# Patient Record
Sex: Female | Born: 1980 | Race: Black or African American | Hispanic: No | Marital: Single | Smoking: Current every day smoker
Health system: Southern US, Community
[De-identification: ages and names within clinical notes are randomized; demographics above are authoritative.]

## PROBLEM LIST (undated history)

## (undated) DIAGNOSIS — J45909 Unspecified asthma, uncomplicated: Secondary | ICD-10-CM

## (undated) HISTORY — PX: TUBAL LIGATION: SHX77

---

## 2003-09-20 ENCOUNTER — Other Ambulatory Visit: Payer: Self-pay

## 2003-10-24 ENCOUNTER — Emergency Department: Payer: Self-pay | Admitting: Emergency Medicine

## 2003-11-04 ENCOUNTER — Inpatient Hospital Stay: Payer: Self-pay | Admitting: Obstetrics & Gynecology

## 2003-11-16 ENCOUNTER — Emergency Department: Payer: Self-pay | Admitting: Unknown Physician Specialty

## 2004-04-12 ENCOUNTER — Emergency Department: Payer: Self-pay | Admitting: Emergency Medicine

## 2004-05-20 ENCOUNTER — Emergency Department: Payer: Self-pay | Admitting: General Practice

## 2004-05-29 ENCOUNTER — Ambulatory Visit: Payer: Self-pay | Admitting: Family Medicine

## 2005-02-08 ENCOUNTER — Emergency Department: Payer: Self-pay | Admitting: Emergency Medicine

## 2005-02-13 ENCOUNTER — Emergency Department: Payer: Self-pay | Admitting: General Practice

## 2005-03-02 ENCOUNTER — Emergency Department: Payer: Self-pay | Admitting: Emergency Medicine

## 2005-07-12 ENCOUNTER — Emergency Department: Payer: Self-pay | Admitting: Unknown Physician Specialty

## 2005-09-27 ENCOUNTER — Emergency Department: Payer: Self-pay | Admitting: Internal Medicine

## 2006-06-04 ENCOUNTER — Emergency Department: Payer: Self-pay | Admitting: Emergency Medicine

## 2006-11-14 ENCOUNTER — Emergency Department: Payer: Self-pay

## 2007-03-24 ENCOUNTER — Emergency Department: Payer: Self-pay | Admitting: Emergency Medicine

## 2007-06-07 ENCOUNTER — Emergency Department: Payer: Self-pay | Admitting: Emergency Medicine

## 2007-06-17 ENCOUNTER — Emergency Department: Payer: Self-pay | Admitting: Emergency Medicine

## 2007-07-06 ENCOUNTER — Emergency Department: Payer: Self-pay | Admitting: Unknown Physician Specialty

## 2007-07-17 ENCOUNTER — Emergency Department: Payer: Self-pay | Admitting: Emergency Medicine

## 2007-09-02 ENCOUNTER — Emergency Department: Payer: Self-pay | Admitting: Emergency Medicine

## 2007-09-17 ENCOUNTER — Emergency Department: Payer: Self-pay | Admitting: Emergency Medicine

## 2007-10-02 ENCOUNTER — Observation Stay: Payer: Self-pay

## 2007-11-09 ENCOUNTER — Observation Stay: Payer: Self-pay

## 2007-12-10 ENCOUNTER — Observation Stay: Payer: Self-pay

## 2007-12-28 ENCOUNTER — Observation Stay: Payer: Self-pay | Admitting: Unknown Physician Specialty

## 2008-01-17 ENCOUNTER — Observation Stay: Payer: Self-pay

## 2008-01-20 ENCOUNTER — Observation Stay: Payer: Self-pay

## 2008-01-29 ENCOUNTER — Observation Stay: Payer: Self-pay | Admitting: Unknown Physician Specialty

## 2008-01-30 ENCOUNTER — Inpatient Hospital Stay: Payer: Self-pay

## 2008-05-31 ENCOUNTER — Emergency Department: Payer: Self-pay | Admitting: Emergency Medicine

## 2008-10-14 ENCOUNTER — Emergency Department: Payer: Self-pay | Admitting: Internal Medicine

## 2008-12-22 ENCOUNTER — Emergency Department: Payer: Self-pay | Admitting: Emergency Medicine

## 2009-01-19 HISTORY — PX: TUBAL LIGATION: SHX77

## 2009-02-01 ENCOUNTER — Emergency Department: Payer: Self-pay

## 2009-02-27 ENCOUNTER — Emergency Department: Payer: Self-pay | Admitting: Emergency Medicine

## 2009-07-27 ENCOUNTER — Emergency Department: Payer: Self-pay | Admitting: Internal Medicine

## 2009-09-06 ENCOUNTER — Emergency Department: Payer: Self-pay | Admitting: Emergency Medicine

## 2010-02-09 ENCOUNTER — Emergency Department: Payer: Self-pay | Admitting: Emergency Medicine

## 2010-02-27 ENCOUNTER — Emergency Department: Payer: Self-pay | Admitting: Emergency Medicine

## 2010-04-11 ENCOUNTER — Emergency Department: Payer: Self-pay | Admitting: Emergency Medicine

## 2010-05-22 ENCOUNTER — Emergency Department: Payer: Self-pay | Admitting: Emergency Medicine

## 2011-03-23 ENCOUNTER — Emergency Department: Payer: Self-pay | Admitting: Emergency Medicine

## 2011-06-30 ENCOUNTER — Emergency Department: Payer: Self-pay | Admitting: Emergency Medicine

## 2012-04-29 ENCOUNTER — Emergency Department: Payer: Self-pay | Admitting: Emergency Medicine

## 2012-11-21 ENCOUNTER — Emergency Department: Payer: Self-pay | Admitting: Emergency Medicine

## 2012-12-29 ENCOUNTER — Emergency Department: Payer: Self-pay | Admitting: Emergency Medicine

## 2012-12-29 LAB — CBC
HCT: 34.7 % — ABNORMAL LOW (ref 35.0–47.0)
MCHC: 33.8 g/dL (ref 32.0–36.0)
MCV: 88 fL (ref 80–100)
Platelet: 305 10*3/uL (ref 150–440)
RBC: 3.93 10*6/uL (ref 3.80–5.20)

## 2012-12-29 LAB — BASIC METABOLIC PANEL
EGFR (Non-African Amer.): >60
Potassium: 3.2 mmol/L — ABNORMAL LOW (ref 3.5–5.1)
Sodium: 140 mmol/L (ref 136–145)

## 2013-03-09 ENCOUNTER — Emergency Department: Payer: Self-pay | Admitting: Emergency Medicine

## 2013-03-09 LAB — URINALYSIS, COMPLETE
BILIRUBIN, UR: NEGATIVE
BLOOD: NEGATIVE
Bacteria: NONE SEEN
Glucose,UR: NEGATIVE mg/dL (ref 0–75)
Nitrite: NEGATIVE
Ph: 8 (ref 4.5–8.0)
Protein: NEGATIVE
RBC,UR: 6 /HPF (ref 0–5)
Specific Gravity: 1.021 (ref 1.003–1.030)
Squamous Epithelial: 12

## 2013-03-09 LAB — COMPREHENSIVE METABOLIC PANEL
ALT: 33 U/L (ref 12–78)
ANION GAP: 11 (ref 7–16)
AST: 26 U/L (ref 15–37)
Albumin: 3.7 g/dL (ref 3.4–5.0)
Alkaline Phosphatase: 65 U/L
BILIRUBIN TOTAL: 0.3 mg/dL (ref 0.2–1.0)
BUN: 8 mg/dL (ref 7–18)
CHLORIDE: 108 mmol/L — AB (ref 98–107)
CREATININE: 0.81 mg/dL (ref 0.60–1.30)
Calcium, Total: 9 mg/dL (ref 8.5–10.1)
Co2: 22 mmol/L (ref 21–32)
EGFR (African American): >60
EGFR (Non-African Amer.): >60
GLUCOSE: 106 mg/dL — AB (ref 65–99)
Osmolality: 280 (ref 275–301)
Potassium: 3.8 mmol/L (ref 3.5–5.1)
Sodium: 141 mmol/L (ref 136–145)
Total Protein: 7.9 g/dL (ref 6.4–8.2)

## 2013-03-09 LAB — CBC WITH DIFFERENTIAL/PLATELET
BASOS ABS: 0 10*3/uL (ref 0.0–0.1)
BASOS PCT: 0.1 %
EOS PCT: 0.2 %
Eosinophil #: 0 10*3/uL (ref 0.0–0.7)
HCT: 37.3 % (ref 35.0–47.0)
HGB: 12.3 g/dL (ref 12.0–16.0)
LYMPHS ABS: 0.4 10*3/uL — AB (ref 1.0–3.6)
Lymphocyte %: 5.2 %
MCH: 29.7 pg (ref 26.0–34.0)
MCHC: 33 g/dL (ref 32.0–36.0)
MCV: 90 fL (ref 80–100)
MONOS PCT: 3 %
Monocyte #: 0.2 x10 3/mm (ref 0.2–0.9)
NEUTROS ABS: 7.6 10*3/uL — AB (ref 1.4–6.5)
Neutrophil %: 91.5 %
PLATELETS: 265 10*3/uL (ref 150–440)
RBC: 4.14 10*6/uL (ref 3.80–5.20)
RDW: 17 % — ABNORMAL HIGH (ref 11.5–14.5)
WBC: 8.3 10*3/uL (ref 3.6–11.0)

## 2013-03-09 LAB — PREGNANCY, URINE: Pregnancy Test, Urine: NEGATIVE m[IU]/mL

## 2013-06-16 ENCOUNTER — Emergency Department: Payer: Self-pay | Admitting: Emergency Medicine

## 2013-11-26 ENCOUNTER — Emergency Department: Payer: Self-pay | Admitting: Emergency Medicine

## 2014-04-11 ENCOUNTER — Emergency Department: Payer: Self-pay | Admitting: Emergency Medicine

## 2014-07-06 ENCOUNTER — Encounter: Payer: Self-pay | Admitting: Emergency Medicine

## 2014-07-06 ENCOUNTER — Emergency Department
Admission: EM | Admit: 2014-07-06 | Discharge: 2014-07-06 | Disposition: A | Discharge status: Home / Self Care | Payer: Self-pay | Attending: Emergency Medicine | Admitting: Emergency Medicine

## 2014-07-06 DIAGNOSIS — Z720 Tobacco use: Secondary | ICD-10-CM | POA: Insufficient documentation

## 2014-07-06 DIAGNOSIS — K047 Periapical abscess without sinus: Secondary | ICD-10-CM | POA: Insufficient documentation

## 2014-07-06 DIAGNOSIS — K029 Dental caries, unspecified: Secondary | ICD-10-CM | POA: Insufficient documentation

## 2014-07-06 MED ORDER — IBUPROFEN 800 MG PO TABS: 800.0000 mg | ORAL_TABLET | Freq: Three times a day (TID) | ORAL | Status: DC

## 2014-07-06 MED ORDER — AMOXICILLIN 500 MG PO CAPS: 500.0000 mg | ORAL_CAPSULE | Freq: Three times a day (TID) | ORAL | Status: DC

## 2014-07-06 NOTE — Discharge Instructions (Signed)
Dental Abscess A dental abscess is a collection of infected fluid (pus) from a bacterial infection in the inner part of the tooth (pulp). It usually occurs at the end of the tooth's root.  CAUSES   Severe tooth decay.  Trauma to the tooth that allows bacteria to enter into the pulp, such as a broken or chipped tooth. SYMPTOMS   Severe pain in and around the infected tooth.  Swelling and redness around the abscessed tooth or in the mouth or face.  Tenderness.  Pus drainage.  Bad breath.  Bitter taste in the mouth.  Difficulty swallowing.  Difficulty opening the mouth.  Nausea.  Vomiting.  Chills.  Swollen neck glands. DIAGNOSIS   A medical and dental history will be taken.  An examination will be performed by tapping on the abscessed tooth.  X-rays may be taken of the tooth to identify the abscess. TREATMENT The goal of treatment is to eliminate the infection. You may be prescribed antibiotic medicine to stop the infection from spreading. A root canal may be performed to save the tooth. If the tooth cannot be saved, it may be pulled (extracted) and the abscess may be drained.  HOME CARE INSTRUCTIONS  Only take over-the-counter or prescription medicines for pain, fever, or discomfort as directed by your caregiver.  Rinse your mouth (gargle) often with salt water ( tsp salt in 8 oz [250 ml] of warm water) to relieve pain or swelling.  Do not drive after taking pain medicine (narcotics).  Do not apply heat to the outside of your face.  Return to your dentist for further treatment as directed. SEEK MEDICAL CARE IF:  Your pain is not helped by medicine.  Your pain is getting worse instead of better. SEEK IMMEDIATE MEDICAL CARE IF:  You have a fever or persistent symptoms for more than 2-3 days.  You have a fever and your symptoms suddenly get worse.  You have chills or a very bad headache.  You have problems breathing or swallowing.  You have trouble  opening your mouth.  You have swelling in the neck or around the eye. Document Released: 01/05/2005 Document Revised: 09/30/2011 Document Reviewed: 04/15/2010 [facility] Patient Information 2015 [place], [place]. This information is not intended to replace advice given to you by your health care provider. Make sure you discuss any questions you have with your health care provider.   OPTIONS FOR DENTAL FOLLOW UP CARE  [place] [facility] and [place] [email or web address] [phone])  [facility] [phone])  [place] [phone])  [place] [phone])  [facility] [phone]) This clinic caters to the indigent population and is on a lottery system. Location: [employer] [facility], [facility], [place], [place] [facility] Hours: Wednesdays from 6pm - 9pm, patients seen by a lottery system. For dates, call or go to [email or web address] Services: Cleanings, fillings and simple extractions. Payment Options: DENTAL WORK IS FREE OF CHARGE. Bring proof of income or support. Best way to get seen: Arrive at 5:15 pm - this is a lottery, NOT first come/first serve, so arriving earlier will not increase your chances of being seen.     [facility] [phone] Select option 1 for emergencies   Location: [facility], [place], [address] [place] [facility] Hours: No walk-ins accepted - call the day before to schedule an appointment. Check in times  are 9:30 am and 1:30 pm. Services: Simple extractions, temporary fillings, pulpectomy/pulp debridement, uncomplicated abscess drainage. Payment Options: PAYMENT IS DUE AT THE TIME OF SERVICE.  Fee is usually $100-200, additional surgical procedures (e.g. abscess drainage) may  be extra. Cash, checks, Visa/MasterCard accepted.  Can file Medicaid if patient is covered for dental - patient should call case worker to check. No discount for [facility] patients. Best way to get seen: MUST call the day before and get onto the schedule. Can usually be seen the next 1-2 days. No walk-ins accepted.     [place] [phone]   Location: [place], [place] [facility] Hours: M, W, Th, F 8am or 1:30pm, Tues 9a or 1:30 - first come/first served. Services: Simple extractions, temporary fillings, uncomplicated abscess drainage.  You do not need to be an [facility] resident. Payment Options: PAYMENT IS DUE AT THE TIME OF SERVICE. Dental insurance, otherwise sliding scale - bring proof of income or support. Depending on income and treatment needed, cost is usually $50-200. Best way to get seen: Arrive early as it is first come/first served.     [facility] [phone]   Location: [place] [facility] Hours: Mon-Thu 8a-5p Services: Most basic dental services including extractions and fillings. Payment Options: PAYMENT IS DUE AT THE TIME OF SERVICE. Sliding scale, up to 50% off - bring proof if income or support. Medicaid with dental option accepted. Best way to get seen: Call to schedule an appointment, can usually be seen within 2 weeks OR they will try to see walk-ins - show up at [place] or 2p (you may have to wait).     [facility] [place] RESIDENTS ONLY   Location: [facility], [place] [address] [place],  [address] Clinic Hours: By appointment only. Monday - Thursday 8am-5pm, Friday 8am-12pm Services: Cleanings, fillings, extractions. Payment Options: PAYMENT IS DUE AT THE TIME OF SERVICE. Cash, Visa or MasterCard. Sliding scale - $30 minimum per service. Best way to get seen: Come in to  office, complete packet and make an appointment - need proof of income or support monies for each household member and proof of [facility] residence. Usually takes about a month to get in.     [facility] [phone]   Location: [address] [place] [facility] Hours: Walk-in Urgent Care Dental Services are offered Monday-Friday mornings only. The numbers of emergencies accepted daily is limited to the number of providers available. Maximum 15 - Mondays, Wednesdays & Thursdays Maximum 10 - Tuesdays & Fridays Services: You do not need to be a [facility] resident to be seen for a dental emergency. Emergencies are defined as pain, swelling, abnormal bleeding, or dental trauma. Walkins will receive x-rays if needed. NOTE: Dental cleaning is not an emergency. Payment Options: PAYMENT IS DUE AT THE TIME OF SERVICE. Minimum co-pay is $40.00 for uninsured patients. Minimum co-pay is $3.00 for Medicaid with dental coverage. Dental Insurance is accepted and must be presented at time of visit. Medicare does not cover dental. Forms of payment: Cash, credit card, checks. Best way to get seen: If not previously registered with the clinic, walk-in dental registration begins at 7:15 am and is on a first come/first serve basis. If previously registered with the clinic, call to make an appointment.     [facility] [place] ONLY   Location: [address]  [place], [place] Clinic Hours: Mon-Thu 10a-2p Services: Extractions only! Payment Options: FREE (donations accepted) - bring proof of income or support Best way to get seen: Call and schedule an appointment OR come at 8am on the 1st Monday of every month (except for holidays) when it is first come/first served.     [facility] [phone]   Location: [place], [place] Clinic Hours: Friday mornings Services, Payment Options, Best way to get  seen: Call for info

## 2014-07-06 NOTE — ED Notes (Signed)
Pt to ed with c/o upper left dental pain since last night.

## 2014-07-06 NOTE — ED Provider Notes (Signed)
[facility] Emergency Department Provider Note  ____________________________________________  Time seen: Approximately 8:05 AM  I have reviewed the triage vital signs and the nursing notes.   HISTORY  Chief Complaint Dental Pain    HPI Carolyn Bryant is a 34 y.o. female is here today with complaint of left dental pain since last evening. She states she has taken some ibuprofen yesterday with minimal relief. She does not have a regular dentist to see. Currently her pain is 7 out of 10. She continues to smoke which she states makes her teeth hurt more. There is nothing that she has taken making it feel better.She has not seen a dentist in many years.   History reviewed. No pertinent past medical history.  There are no active problems to display for this patient.   Past Surgical History  Procedure Laterality Date  . Tubal ligation      Current Outpatient Rx  Name  Route  Sig  Dispense  Refill  . amoxicillin (AMOXIL) 500 MG capsule   Oral   Take 1 capsule (500 mg total) by mouth 3 (three) times daily.   30 capsule   0   . ibuprofen (ADVIL,MOTRIN) 800 MG tablet   Oral   Take 1 tablet (800 mg total) by mouth 3 (three) times daily.   30 tablet   0     Allergies Prednisone  History reviewed. No pertinent family history.  Social History History  Substance Use Topics  . Smoking status: Current Every Day Smoker  . Smokeless tobacco: Not on file  . Alcohol Use: Yes    Review of Systems Constitutional: No fever/chills Eyes: No visual changes. ENT: No sore throat. Cardiovascular: Denies chest pain. Respiratory: Denies shortness of breath. Gastrointestinal: No abdominal pain.  No nausea, no vomiting. Genitourinary: Negative for dysuria. Musculoskeletal: Negative for back pain. Skin: Negative for rash. Neurological: Negative for headaches  10-point ROS otherwise negative.  ____________________________________________   PHYSICAL  EXAM:  VITAL SIGNS: ED Triage Vitals  Enc Vitals Group     BP 07/06/14 0736 131/83 mmHg     Pulse Rate 07/06/14 0736 81     Resp 07/06/14 0736 18     Temp 07/06/14 0736 98.2 F (36.8 C)     Temp Source 07/06/14 0736 Oral     SpO2 07/06/14 0736 100 %     Weight 07/06/14 0736 132 lb (59.875 kg)     Height 07/06/14 0736 5\' 3"  (1.6 m)     Head Cir --      Peak Flow --      Pain Score 07/06/14 0738 7     Pain Loc --      Pain Edu? --      Excl. in [place]? --     Constitutional: Alert and oriented. Well appearing and in no acute distress. Eyes: Conjunctivae are normal. PERRL. EOMI. Head: Atraumatic. Nose: No congestion/rhinnorhea. Mouth/Throat: Mucous membranes are moist.  Oropharynx non-erythematous. Left upper and lower teeth are in very poor dental hygiene. There are multiple dental caries present. Lower molar with, edema and tenderness. No actual abscess is seen. Neck: No stridor.  [place]le Hematological/Lymphatic/Immunilogical: No cervical lymphadenopathy. Cardiovascular: Normal rate, regular rhythm. Grossly normal heart sounds.  Good peripheral circulation. Respiratory: Normal respiratory effort.  No retractions. Lungs CTAB. Gastrointestinal: Soft and nontender. No distention.  Musculoskeletal: No lower extremity tenderness nor edema.  No joint effusions. Neurologic:  Normal speech and language. No gross focal neurologic deficits are appreciated. Speech is  normal. No gait instability. Skin:  Skin is warm, dry and intact. No rash noted. Psychiatric: Mood and affect are normal. Speech and behavior are normal.  ____________________________________________   LABS (all labs ordered are listed, but only abnormal results are displayed)  Labs Reviewed - No data to display  PROCEDURES  Procedure(s) performed: None  Critical Care performed: No  ____________________________________________   INITIAL IMPRESSION / ASSESSMENT AND PLAN / ED COURSE  Pertinent labs & imaging results  that were available during my care of the patient were reviewed by me and considered in my medical decision making (see chart for details).  Patient was given a list of dental clinics in the area that on a sliding scale. She is given a prescription for amoxicillin and ibuprofen to begin today. ____________________________________________   FINAL CLINICAL IMPRESSION(S) / ED DIAGNOSES  Final diagnoses:  Dental abscess  Dental caries      Johnn Hai, PA-C 07/06/14 1055  Harvest Dark, MD 07/06/14 1423

## 2014-08-21 ENCOUNTER — Emergency Department: Payer: Self-pay

## 2014-08-21 ENCOUNTER — Encounter: Payer: Self-pay | Admitting: Emergency Medicine

## 2014-08-21 ENCOUNTER — Emergency Department
Admission: EM | Admit: 2014-08-21 | Discharge: 2014-08-21 | Disposition: A | Discharge status: Home / Self Care | Payer: Self-pay | Attending: Emergency Medicine | Admitting: Emergency Medicine

## 2014-08-21 DIAGNOSIS — Z3202 Encounter for pregnancy test, result negative: Secondary | ICD-10-CM | POA: Insufficient documentation

## 2014-08-21 DIAGNOSIS — Z720 Tobacco use: Secondary | ICD-10-CM | POA: Insufficient documentation

## 2014-08-21 DIAGNOSIS — N390 Urinary tract infection, site not specified: Secondary | ICD-10-CM | POA: Insufficient documentation

## 2014-08-21 DIAGNOSIS — R109 Unspecified abdominal pain: Secondary | ICD-10-CM

## 2014-08-21 HISTORY — DX: Unspecified asthma, uncomplicated: J45.909

## 2014-08-21 LAB — URINALYSIS COMPLETE WITH MICROSCOPIC (ARMC ONLY)
Bilirubin Urine: NEGATIVE
Glucose, UA: NEGATIVE mg/dL
Ketones, ur: NEGATIVE mg/dL
Leukocytes, UA: NEGATIVE
NITRITE: NEGATIVE
Protein, ur: 30 mg/dL — AB
Specific Gravity, Urine: 1.002 — ABNORMAL LOW (ref 1.005–1.030)
pH: 7 (ref 5.0–8.0)

## 2014-08-21 LAB — PREGNANCY, URINE: PREG TEST UR: NEGATIVE

## 2014-08-21 LAB — POCT PREGNANCY, URINE: Preg Test, Ur: NEGATIVE

## 2014-08-21 MED ORDER — IOHEXOL 300 MG/ML  SOLN
100.0000 mL | Freq: Once | INTRAMUSCULAR | Status: AC | PRN
  Administered 2014-08-21: 100 mL via INTRAVENOUS

## 2014-08-21 MED ORDER — SODIUM CHLORIDE 0.9 % IV SOLN
Freq: Once | INTRAVENOUS | Status: AC
  Administered 2014-08-21: 10:00:00 via INTRAVENOUS

## 2014-08-21 MED ORDER — ONDANSETRON HCL 4 MG/2ML IJ SOLN
4.0000 mg | Freq: Once | INTRAMUSCULAR | Status: AC
  Administered 2014-08-21: 4 mg via INTRAVENOUS
  Filled 2014-08-21: qty 2

## 2014-08-21 MED ORDER — MORPHINE SULFATE 2 MG/ML IJ SOLN
2.0000 mg | Freq: Once | INTRAMUSCULAR | Status: AC
  Administered 2014-08-21: 2 mg via INTRAVENOUS
  Filled 2014-08-21: qty 1

## 2014-08-21 MED ORDER — IOHEXOL 240 MG/ML SOLN
25.0000 mL | Freq: Once | INTRAMUSCULAR | Status: AC | PRN
  Administered 2014-08-21: 25 mL via ORAL

## 2014-08-21 NOTE — ED Notes (Signed)
Patient presents to the ED with nausea, vomiting x 4, and lower abdominal pain that began shortly after midnight.  Patient states she is currently on her menstrual cycle.  Patient is gaurding lower abdomen.  Patient also reports multiple episodes of diarrhea.

## 2014-08-21 NOTE — Discharge Instructions (Signed)
Abdominal Pain, Women °Abdominal (stomach, pelvic, or belly) pain can be caused by many things. It is important to tell your doctor: °· The location of the pain. °· Does it come and go or is it present all the time? °· Are there things that start the pain (eating certain foods, exercise)? °· Are there other symptoms associated with the pain (fever, nausea, vomiting, diarrhea)? °All of this is helpful to know when trying to find the cause of the pain. °CAUSES  °· Stomach: virus or bacteria infection, or ulcer. °· Intestine: appendicitis (inflamed appendix), regional ileitis (Crohn's disease), ulcerative colitis (inflamed colon), irritable bowel syndrome, diverticulitis (inflamed diverticulum of the colon), or cancer of the stomach or intestine. °· Gallbladder disease or stones in the gallbladder. °· Kidney disease, kidney stones, or infection. °· Pancreas infection or cancer. °· Fibromyalgia (pain disorder). °· Diseases of the female organs: °¨ Uterus: fibroid (non-cancerous) tumors or infection. °¨ Fallopian tubes: infection or tubal pregnancy. °¨ Ovary: cysts or tumors. °¨ Pelvic adhesions (scar tissue). °¨ Endometriosis (uterus lining tissue growing in the pelvis and on the pelvic organs). °¨ Pelvic congestion syndrome (female organs filling up with blood just before the menstrual period). °¨ Pain with the menstrual period. °¨ Pain with ovulation (producing an egg). °¨ Pain with an IUD (intrauterine device, birth control) in the uterus. °¨ Cancer of the female organs. °· Functional pain (pain not caused by a disease, may improve without treatment). °· Psychological pain. °· Depression. °DIAGNOSIS  °Your doctor will decide the seriousness of your pain by doing an examination. °· Blood tests. °· X-rays. °· Ultrasound. °· CT scan (computed tomography, special type of X-ray). °· MRI (magnetic resonance imaging). °· Cultures, for infection. °· Barium enema (dye inserted in the large intestine, to better view it with  X-rays). °· Colonoscopy (looking in intestine with a lighted tube). °· Laparoscopy (minor surgery, looking in abdomen with a lighted tube). °· Major abdominal exploratory surgery (looking in abdomen with a large incision). °TREATMENT  °The treatment will depend on the cause of the pain.  °· Many cases can be observed and treated at home. °· Over-the-counter medicines recommended by your caregiver. °· Prescription medicine. °· Antibiotics, for infection. °· Birth control pills, for painful periods or for ovulation pain. °· Hormone treatment, for endometriosis. °· Nerve blocking injections. °· Physical therapy. °· Antidepressants. °· Counseling with a psychologist or psychiatrist. °· Minor or major surgery. °HOME CARE INSTRUCTIONS  °· Do not take laxatives, unless directed by your caregiver. °· Take over-the-counter pain medicine only if ordered by your caregiver. Do not take aspirin because it can cause an upset stomach or bleeding. °· Try a clear liquid diet (broth or water) as ordered by your caregiver. Slowly move to a bland diet, as tolerated, if the pain is related to the stomach or intestine. °· Have a thermometer and take your temperature several times a day, and record it. °· Bed rest and sleep, if it helps the pain. °· Avoid sexual intercourse, if it causes pain. °· Avoid stressful situations. °· Keep your follow-up appointments and tests, as your caregiver orders. °· If the pain does not go away with medicine or surgery, you may try: °¨ Acupuncture. °¨ Relaxation exercises (yoga, meditation). °¨ Group therapy. °¨ Counseling. °SEEK MEDICAL CARE IF:  °· You notice certain foods cause stomach pain. °· Your home care treatment is not helping your pain. °· You need stronger pain medicine. °· You want your IUD removed. °· You feel faint or   lightheaded.  You develop nausea and vomiting.  You develop a rash.  You are having side effects or an allergy to your medicine. SEEK IMMEDIATE MEDICAL CARE IF:   Your  pain does not go away or gets worse.  You have a fever.  Your pain is felt only in portions of the abdomen. The right side could possibly be appendicitis. The left lower portion of the abdomen could be colitis or diverticulitis.  You are passing blood in your stools (bright red or black tarry stools, with or without vomiting).  You have blood in your urine.  You develop chills, with or without a fever.  You pass out. MAKE SURE YOU:   Understand these instructions.  Will watch your condition.  Will get help right away if you are not doing well or get worse. Document Released: 11/02/2006 Document Revised: 05/22/2013 Document Reviewed: 11/22/2008 [facility] Patient Information 2015 [place], [place]. This information is not intended to replace advice given to you by your health care provider. Make sure you discuss any questions you have with your health care provider.  Please use the keflex 500 mg 1 pill  4 x a day for the uti. You can take motrin 800 mg up to 3 x a day as needed for the pain (take it with food.)  Please return for fever, worse pain or continued vomiting.

## 2014-08-21 NOTE — ED Provider Notes (Signed)
[facility] Emergency Department Provider Note  ____________________________________________  Time seen: Approximately 1:13 PM  I have reviewed the triage vital signs and the nursing notes.   HISTORY  Chief Complaint Abdominal Pain    HPI Carolyn Bryant is a 34 y.o. female patient started her menstrual period last night. She began to have abdominal pain. She vomited 4-5 times. Began and began to have diarrhea. She has had this kind of thing in the past but the cramps and pain were worse than usual. She came to the ER still in discomfort. She has not had a fever.   Past Medical History  Diagnosis Date  . Asthma     There are no active problems to display for this patient.   Past Surgical History  Procedure Laterality Date  . Tubal ligation      Current Outpatient Rx  Name  Route  Sig  Dispense  Refill  . amoxicillin (AMOXIL) 500 MG capsule   Oral   Take 1 capsule (500 mg total) by mouth 3 (three) times daily. Patient not taking: Reported on 08/21/2014   30 capsule   0   . ibuprofen (ADVIL,MOTRIN) 800 MG tablet   Oral   Take 1 tablet (800 mg total) by mouth 3 (three) times daily. Patient not taking: Reported on 08/21/2014   30 tablet   0     Allergies Peanuts and Prednisone  No family history on file.  Social History History  Substance Use Topics  . Smoking status: Current Every Day Smoker  . Smokeless tobacco: Not on file  . Alcohol Use: Yes    Review of Systems Constitutional: No fever/chills Eyes: No visual changes. ENT: No sore throat. Cardiovascular: Denies chest pain. Respiratory: Denies shortness of breath. Gastrointestinal: See history of present illness  .  No constipation. Genitourinary: Negative for dysuria. Musculoskeletal: Negative for back pain. Skin: Negative for rash. Neurological: Negative for headaches, focal weakness or numbness.  10-point ROS otherwise  negative.  ____________________________________________   PHYSICAL EXAM:  VITAL SIGNS: ED Triage Vitals  Enc Vitals Group     BP 08/21/14 0812 150/80 mmHg     Pulse Rate 08/21/14 0812 102     Resp 08/21/14 0812 20     Temp --      Temp src --      SpO2 08/21/14 0812 100 %     Weight 08/21/14 0808 135 lb (61.236 kg)     Height 08/21/14 0808 5\' 2"  (1.575 m)     Head Cir --      Peak Flow --      Pain Score 08/21/14 0808 9     Pain Loc --      Pain Edu? --      Excl. in [place]? --     Constitutional: Alert and oriented. Well appearing and in mild distress Eyes: Conjunctivae are normal. PERRL. EOMI. Head: Atraumatic. Nose: No congestion/rhinnorhea. Mouth/Throat: Mucous membranes are moist.  Oropharynx non-erythematous. Neck: No stridor. Cardiovascular: Normal rate, regular rhythm. Grossly normal heart sounds.  Good peripheral circulation. Respiratory: Normal respiratory effort.  No retractions. Lungs CTAB. Gastrointestinal: Soft mildly diffusely tender. No distention. No abdominal bruits. No CVA tenderness. Musculoskeletal: No lower extremity tenderness nor edema.  No joint effusions. Neurologic:  Normal speech and language. No gross focal neurologic deficits are appreciated. No gait instability. Skin:  Skin is warm, dry and intact. No rash noted. Psychiatric: Mood and affect are normal. Speech and behavior are normal.  ____________________________________________  LABS (all labs ordered are listed, but only abnormal results are displayed)  Labs Reviewed  URINALYSIS COMPLETEWITH MICROSCOPIC ([place]) - Abnormal; Notable for the following:    Color, Urine YELLOW (*)    APPearance CLEAR (*)    Specific Gravity, Urine 1.002 (*)    Hgb urine dipstick 3+ (*)    Protein, ur 30 (*)    Bacteria, UA RARE (*)    Squamous Epithelial / LPF 0-5 (*)    All other components within normal limits  PREGNANCY, URINE  POCT PREGNANCY, URINE    ____________________________________________  EKG   ____________________________________________  RADIOLOGY  CT was read as some free fluid lower down in the pelvis which may represent inflammation. There is no other findings. ____________________________________________   PROCEDURES    ____________________________________________   INITIAL IMPRESSION / ASSESSMENT AND PLAN / ED COURSE  Pertinent labs & imaging results that were available during my care of the patient were reviewed by me and considered in my medical decision making (see chart for details).  Patient denies any chance of STD. She does have a few white cells in her urine we'll treat her with Keflex for UTI ____________________________________________   FINAL CLINICAL IMPRESSION(S) / ED DIAGNOSES  Final diagnoses:  Abdominal pain, unspecified abdominal location  UTI (lower urinary tract infection)      Nena Polio, MD 08/21/14 1606

## 2014-10-31 ENCOUNTER — Encounter (HOSPITAL_COMMUNITY): Payer: Self-pay | Admitting: *Deleted

## 2014-10-31 ENCOUNTER — Emergency Department (HOSPITAL_COMMUNITY)
Admission: EM | Admit: 2014-10-31 | Discharge: 2014-10-31 | Disposition: A | Discharge status: Home / Self Care | Payer: Self-pay | Attending: Emergency Medicine | Admitting: Emergency Medicine

## 2014-10-31 DIAGNOSIS — J45909 Unspecified asthma, uncomplicated: Secondary | ICD-10-CM | POA: Insufficient documentation

## 2014-10-31 DIAGNOSIS — Z791 Long term (current) use of non-steroidal anti-inflammatories (NSAID): Secondary | ICD-10-CM | POA: Insufficient documentation

## 2014-10-31 DIAGNOSIS — Z720 Tobacco use: Secondary | ICD-10-CM | POA: Insufficient documentation

## 2014-10-31 DIAGNOSIS — Z792 Long term (current) use of antibiotics: Secondary | ICD-10-CM | POA: Insufficient documentation

## 2014-10-31 DIAGNOSIS — H1031 Unspecified acute conjunctivitis, right eye: Secondary | ICD-10-CM | POA: Insufficient documentation

## 2014-10-31 DIAGNOSIS — H109 Unspecified conjunctivitis: Secondary | ICD-10-CM

## 2014-10-31 MED ORDER — FLUORESCEIN SODIUM 1 MG OP STRP
1.0000 | ORAL_STRIP | Freq: Once | OPHTHALMIC | Status: AC
  Administered 2014-10-31: 1 via OPHTHALMIC
  Filled 2014-10-31: qty 1

## 2014-10-31 MED ORDER — POLYMYXIN B-TRIMETHOPRIM 10000-0.1 UNIT/ML-% OP SOLN: 2.0000 [drp] | Freq: Four times a day (QID) | OPHTHALMIC | Status: DC

## 2014-10-31 MED ORDER — TETRACAINE HCL 0.5 % OP SOLN
1.0000 [drp] | Freq: Once | OPHTHALMIC | Status: AC
  Administered 2014-10-31: 1 [drp] via OPHTHALMIC
  Filled 2014-10-31: qty 2

## 2014-10-31 NOTE — ED Notes (Signed)
Declined W/C at D/C and was escorted to lobby by RN. 

## 2014-10-31 NOTE — ED Notes (Signed)
PT reports the RT eye has been draining for several  Days .

## 2014-10-31 NOTE — Discharge Instructions (Signed)

## 2014-10-31 NOTE — ED Provider Notes (Signed)
CSN: [id number]     Arrival date & time 10/31/14  9937 History   By signing my name below, I, Evelene Croon, attest that this documentation has been prepared under the direction and in the presence of non-physician practitioner, Waynetta Pean, PA-C. Electronically Signed: Evelene Croon, Scribe. 10/31/2014. 10:53 AM.  Chief Complaint  Patient presents with  . Eye Problem   The history is provided by the patient. No language interpreter was used.   HPI Comments:  Carolyn Bryant is a 34 y.o. female who presents to the Emergency Department complaining of drainage from her right eye for 2-2.5 weeks. Pt notes the right eye was slightly matted this am when she woke up; denies pain to the eye or foreign body sensation in the eye. She has used an eye rinse without relief. She does not use contacts or glasses and denies sick contacts at home. She also denies rhionrrhea, ear pain, fever, double vision.   Past Medical History  Diagnosis Date  . Asthma    Past Surgical History  Procedure Laterality Date  . Tubal ligation     History reviewed. No pertinent family history. Social History  Substance Use Topics  . Smoking status: Current Every Day Smoker  . Smokeless tobacco: None  . Alcohol Use: Yes   OB History    Gravida Para Term Preterm AB TAB SAB Ectopic Multiple Living   3 3 3   0  0   2     Review of Systems  Constitutional: Negative for fever.  HENT: Negative for ear discharge, ear pain, rhinorrhea, sneezing, sore throat and trouble swallowing.   Eyes: Positive for discharge and redness. Negative for photophobia, pain and visual disturbance.  Respiratory: Negative for cough and shortness of breath.   Skin: Negative for rash.  Neurological: Negative for light-headedness and headaches.      Allergies  Peanuts and Prednisone  Home Medications   Prior to Admission medications   Medication Sig Start Date End Date Taking? Authorizing Provider  amoxicillin (AMOXIL) 500 MG  capsule Take 1 capsule (500 mg total) by mouth 3 (three) times daily. Patient not taking: Reported on 08/21/2014 07/06/14   Johnn Hai, PA-C  ibuprofen (ADVIL,MOTRIN) 800 MG tablet Take 1 tablet (800 mg total) by mouth 3 (three) times daily. Patient not taking: Reported on 08/21/2014 07/06/14   Johnn Hai, PA-C  trimethoprim-polymyxin b (POLYTRIM) ophthalmic solution Place 2 drops into the right eye every 6 (six) hours. 10/31/14   Waynetta Pean, PA-C   BP 111/69 mmHg  Pulse 78  Temp(Src) 98.6 F (37 C) (Oral)  Resp 16  Ht 5\' 2"  (1.575 m)  Wt 140 lb (63.504 kg)  BMI 25.60 kg/m2  SpO2 100%  LMP 10/06/2014 Physical Exam  Constitutional: She is oriented to person, place, and time. She appears well-developed and well-nourished. No distress.  Nontoxic appearing.  HENT:  Head: Normocephalic and atraumatic.  Right Ear: External ear normal.  Left Ear: External ear normal.  Nose: Nose normal.  Mouth/Throat: Oropharynx is clear and moist. No oropharyngeal exudate.  Eyes: EOM are normal. Pupils are equal, round, and reactive to light. Right eye exhibits no discharge. Left eye exhibits no discharge.  Mild right conjunctival injection. No discharge noted. No styes or hordeolum noted.  Right eye was anesthetized with tetracaine and stained with fluorescein. No evidence of corneal abrasion noted. No fluorescein uptake. Lids were swpt for foreign bodies with no evidence of foreign body.  Neck: Normal range of motion. Neck  supple. No JVD present. No tracheal deviation present.  Cardiovascular: Normal rate, regular rhythm, normal heart sounds and intact distal pulses.   Pulmonary/Chest: Effort normal. No respiratory distress.  Abdominal: Soft. There is no tenderness.  Lymphadenopathy:    She has no cervical adenopathy.  Neurological: She is alert and oriented to person, place, and time. No cranial nerve deficit. Coordination normal.  Skin: Skin is warm and dry. No rash noted. She is not  diaphoretic. No erythema. No pallor.  Psychiatric: She has a normal mood and affect. Her behavior is normal.  Nursing note and vitals reviewed.   ED Course  Procedures   DIAGNOSTIC STUDIES:  Oxygen Saturation is 100% on RA, normal by my interpretation.    COORDINATION OF CARE:  10:40 AM Discussed treatment plan with pt at bedside and pt agreed to plan.  Labs Review Labs Reviewed - No data to display  Imaging Review No results found.    EKG Interpretation None      Filed Vitals:   10/31/14 0941 10/31/14 1109  BP: 125/73 111/69  Pulse: 88 78  Temp: 98.6 F (37 C)   TempSrc: Oral Oral  Resp: 16 16  Height: 5\' 2"  (1.575 m)   Weight: 140 lb (63.504 kg)   SpO2: 100% 100%     MDM   Meds given in ED:  Medications  fluorescein ophthalmic strip 1 strip (1 strip Both Eyes Given 10/31/14 1042)  tetracaine (PONTOCAINE) 0.5 % ophthalmic solution 1 drop (1 drop Both Eyes Given 10/31/14 1042)    Discharge Medication List as of 10/31/2014 11:04 AM    START taking these medications   Details  trimethoprim-polymyxin b (POLYTRIM) ophthalmic solution Place 2 drops into the right eye every 6 (six) hours., Starting 10/31/2014, Until Discontinued, Print        Final diagnoses:  Conjunctivitis of right eye   This is a 34 year old female who presents with 2-2 and half weeks of right eye discharge and matting this morning. On exam patient is afebrile nontoxic appearing. Patient does have small amount of right corneal injection. No discharge noted. EOMs are intact bilaterally. Patient denies eye pain or sensation of foreign body. I was in a sizer tetracaine and stained with fluorescein. No evidence of forcing uptake or corneal abrasions on exam. No evidence of foreign bodies. Will discharge the patient with prescription for eye abx for possible conjunctivitis. Also advised to follow-up with ophthalmology for reevaluation of her eye. I advised the patient to follow-up with their  primary care provider this week. I advised the patient to return to the emergency department with new or worsening symptoms or new concerns. The patient verbalized understanding and agreement with plan.     I, Hanley Hays, personally performed the services described in this documentation. All medical record entries made by the scribe were at my direction and in my presence.  I have reviewed the chart and discharge instructions and agree that the record reflects my personal performance and is accurate and complete. Diella Gillingham Duncan.  10/31/2014. 11:25 AM.      Waynetta Pean, PA-C 10/31/14 [place], DO 11/01/14 1502

## 2015-03-02 ENCOUNTER — Emergency Department
Admission: EM | Admit: 2015-03-02 | Discharge: 2015-03-02 | Disposition: A | Discharge status: Home / Self Care | Payer: Self-pay | Attending: Emergency Medicine | Admitting: Emergency Medicine

## 2015-03-02 ENCOUNTER — Encounter: Payer: Self-pay | Admitting: Emergency Medicine

## 2015-03-02 DIAGNOSIS — K047 Periapical abscess without sinus: Secondary | ICD-10-CM | POA: Insufficient documentation

## 2015-03-02 DIAGNOSIS — K0263 Dental caries on smooth surface penetrating into pulp: Secondary | ICD-10-CM | POA: Insufficient documentation

## 2015-03-02 DIAGNOSIS — F17210 Nicotine dependence, cigarettes, uncomplicated: Secondary | ICD-10-CM | POA: Insufficient documentation

## 2015-03-02 DIAGNOSIS — K0381 Cracked tooth: Secondary | ICD-10-CM | POA: Insufficient documentation

## 2015-03-02 DIAGNOSIS — K029 Dental caries, unspecified: Secondary | ICD-10-CM

## 2015-03-02 DIAGNOSIS — Z792 Long term (current) use of antibiotics: Secondary | ICD-10-CM | POA: Insufficient documentation

## 2015-03-02 MED ORDER — TRAMADOL HCL 50 MG PO TABS
100.0000 mg | ORAL_TABLET | Freq: Once | ORAL | Status: AC
  Administered 2015-03-02: 100 mg via ORAL
  Filled 2015-03-02: qty 2

## 2015-03-02 MED ORDER — PENICILLIN V POTASSIUM 500 MG PO TABS: 500.0000 mg | ORAL_TABLET | Freq: Four times a day (QID) | ORAL | Status: DC

## 2015-03-02 MED ORDER — PENICILLIN V POTASSIUM 500 MG PO TABS
500.0000 mg | ORAL_TABLET | Freq: Once | ORAL | Status: AC
  Administered 2015-03-02: 500 mg via ORAL
  Filled 2015-03-02: qty 1

## 2015-03-02 MED ORDER — TRAMADOL HCL 50 MG PO TABS: 50.0000 mg | ORAL_TABLET | Freq: Two times a day (BID) | ORAL | Status: DC

## 2015-03-02 NOTE — ED Provider Notes (Signed)
[facility] Emergency Department Provider Note ____________________________________________  Time seen: 1718  I have reviewed the triage vital signs and the nursing notes.  HISTORY  Chief Complaint  Dental Pain  HPI Carolyn Bryant is a 35 y.o. female since a the ED for evaluation of pain, and swelling to the left lower jaw after she had a piece of her tooth break off last week. She does admit to poor dental hygiene and multiple fractured teeth and dental caries. She noted that the swelling and some point was up to her eye. She has dose Advil and Advil PM with limited benefit. He also has been using Orajel and Goody's powders for symptom relief. She denies any fevers, chills, sweats. She rates her pain and discomfort 8/10 in triage.  Past Medical History  Diagnosis Date  . Asthma     There are no active problems to display for this patient.   Past Surgical History  Procedure Laterality Date  . Tubal ligation      Current Outpatient Rx  Name  Route  Sig  Dispense  Refill  . penicillin v potassium (VEETID) 500 MG tablet   Oral   Take 1 tablet (500 mg total) by mouth 4 (four) times daily.   40 tablet   0   . traMADol (ULTRAM) 50 MG tablet   Oral   Take 1 tablet (50 mg total) by mouth 2 (two) times daily.   10 tablet   0   . trimethoprim-polymyxin b (POLYTRIM) ophthalmic solution   Right Eye   Place 2 drops into the right eye every 6 (six) hours.   10 mL   0    Allergies Peanuts and Prednisone  No family history on file.  Social History Social History  Substance Use Topics  . Smoking status: Current Every Day Smoker -- 0.10 packs/day    Types: Cigarettes  . Smokeless tobacco: None  . Alcohol Use: Yes   Review of Systems  Constitutional: Negative for fever. Eyes: Negative for visual changes. ENT: Negative for sore throat. Dental pain as above.  Respiratory: Negative for shortness of breath. Skin: Negative for rash. Neurological:  Negative for headaches, focal weakness or numbness. ____________________________________________  PHYSICAL EXAM:  VITAL SIGNS: ED Triage Vitals  Enc Vitals Group     BP 03/02/15 1501 135/87 mmHg     Pulse Rate 03/02/15 1501 70     Resp 03/02/15 1501 18     Temp 03/02/15 1501 98.5 F (36.9 C)     Temp Source 03/02/15 1501 Oral     SpO2 03/02/15 1501 98 %     Weight 03/02/15 1501 138 lb (62.596 kg)     Height 03/02/15 1501 5\' 2"  (1.575 m)     Head Cir --      Peak Flow --      Pain Score 03/02/15 1502 8     Pain Loc --      Pain Edu? --      Excl. in [place]? --    Constitutional: Alert and oriented. Well appearing and in no distress. Head: Normocephalic and atraumatic.      Eyes: Conjunctivae are normal. PERRL. Normal extraocular movements      Ears: Canals clear. TMs intact bilaterally.   Nose: No congestion/rhinorrhea.   Mouth/Throat: Mucous membranes are moist. Patient with widespread dental caries, gingivitis, and plaque build up. Patient to have several molars that are fractured and chronically decayed. Her primary concern is to the left lower  jaw where she has a chronically decayed and fractured second molar. Subtle swelling is noted to the buccal mucosa.   Neck: Supple. No thyromegaly. Hematological/Lymphatic/Immunological: No cervical lymphadenopathy. Cardiovascular: Normal rate, regular rhythm.  Respiratory: Normal respiratory effort. No wheezes/rales/rhonchi. Neurologic:  Normal gait without ataxia. Normal speech and language. No gross focal neurologic deficits are appreciated. Skin:  Skin is warm, dry and intact. No rash noted. Psychiatric: Mood and affect are normal. Patient exhibits appropriate insight and judgment. ____________________________________________  PROCEDURES  Ultram 100 mg PO Pen VK 500 mg PO ____________________________________________  INITIAL IMPRESSION / ASSESSMENT AND PLAN / ED COURSE  Patient with acute dental abscess in the face of  chronic dental caries and dental fractures. She is provided with the option for pen VK and Ultram No. 10 to dose as needed for pain. She is provided with a list of dental providers for immediate follow-up. She is advised to continue to breast daily and rinse with warm salt water after every meal. She is advised she needs to be evaluated by a dentist provider for definitive treatment of these toe fractures. ____________________________________________  FINAL CLINICAL IMPRESSION(S) / ED DIAGNOSES  Final diagnoses:  Dental abscess  Dental caries into pulp      Melvenia Needles, PA-C 03/02/15 1802  Orbie Pyo, MD 03/02/15 2147

## 2015-03-02 NOTE — ED Notes (Signed)
Toothache x1 week.

## 2015-03-02 NOTE — Discharge Instructions (Signed)
Dental Abscess °A dental abscess is a collection of pus in or around a tooth. °CAUSES °This condition is caused by a bacterial infection around the root of the tooth that involves the inner part of the tooth (pulp). It may result from: °· Severe tooth decay. °· Trauma to the tooth that allows bacteria to enter into the pulp, such as a broken or chipped tooth. °· Severe gum disease around a tooth. °SYMPTOMS °Symptoms of this condition include: °· Severe pain in and around the infected tooth. °· Swelling and redness around the infected tooth, in the mouth, or in the face. °· Tenderness. °· Pus drainage. °· Bad breath. °· Bitter taste in the mouth. °· Difficulty swallowing. °· Difficulty opening the mouth. °· Nausea. °· Vomiting. °· Chills. °· Swollen neck glands. °· Fever. °DIAGNOSIS °This condition is diagnosed with examination of the infected tooth. During the exam, your dentist may tap on the infected tooth. Your dentist will also ask about your medical and dental history and may order X-rays. °TREATMENT °This condition is treated by eliminating the infection. This may be done with: °· Antibiotic medicine. °· A root canal. This may be performed to save the tooth. °· Pulling (extracting) the tooth. This may also involve draining the abscess. This is done if the tooth cannot be saved. °HOME CARE INSTRUCTIONS °· Take medicines only as directed by your dentist. °· If you were prescribed antibiotic medicine, finish all of it even if you start to feel better. °· Rinse your mouth (gargle) often with salt water to relieve pain or swelling. °· Do not drive or operate heavy machinery while taking pain medicine. °· Do not apply heat to the outside of your mouth. °· Keep all follow-up visits as directed by your dentist. This is important. °SEEK MEDICAL CARE IF: °· Your pain is worse and is not helped by medicine. °SEEK IMMEDIATE MEDICAL CARE IF: °· You have a fever or chills. °· Your symptoms suddenly get worse. °· You have a  very bad headache. °· You have problems breathing or swallowing. °· You have trouble opening your mouth. °· You have swelling in your neck or around your eye. °  °This information is not intended to replace advice given to you by your health care provider. Make sure you discuss any questions you have with your health care provider. °  °Document Released: 01/05/2005 Document Revised: 05/22/2014 Document Reviewed: 01/02/2014 °Elsevier Interactive Patient Education ©2016 Elsevier Inc. ° °Dental Caries °Dental caries (also called tooth decay) is the most common oral disease. It can occur at any age but is more common in children and young adults.  °HOW DENTAL CARIES DEVELOPS  °The process of decay begins when bacteria and foods (particularly sugars and starches) combine in your mouth to produce plaque. Plaque is a substance that sticks to the hard, outer surface of a tooth (enamel). The bacteria in plaque produce acids that attack enamel. These acids may also attack the root surface of a tooth (cementum) if it is exposed. Repeated attacks dissolve these surfaces and create holes in the tooth (cavities). If left untreated, the acids destroy the other layers of the tooth.  °RISK FACTORS °· Frequent sipping of sugary beverages.   °· Frequent snacking on sugary and starchy foods, especially those that easily get stuck in the teeth.   °· Poor oral hygiene.   °· Dry mouth.   °· Substance abuse such as methamphetamine abuse.   °· Broken or poor-fitting dental restorations.   °· Eating disorders.   °· Gastroesophageal reflux disease (  GERD).   Certain radiation treatments to the head and neck. SYMPTOMS In the early stages of dental caries, symptoms are seldom present. Sometimes white, chalky areas may be seen on the enamel or other tooth layers. In later stages, symptoms may include:  Pits and holes on the enamel.  Toothache after sweet, hot, or cold foods or drinks are consumed.  Pain around the tooth.  Swelling  around the tooth. DIAGNOSIS  Most of the time, dental caries is detected during a regular dental checkup. A diagnosis is made after a thorough medical and dental history is taken and the surfaces of your teeth are checked for signs of dental caries. Sometimes special instruments, such as lasers, are used to check for dental caries. Dental X-ray exams may be taken so that areas not visible to the eye (such as between the contact areas of the teeth) can be checked for cavities.  TREATMENT  If dental caries is in its early stages, it may be reversed with a fluoride treatment or an application of a remineralizing agent at the dental office. Thorough brushing and flossing at home is needed to aid these treatments. If it is in its later stages, treatment depends on the location and extent of tooth destruction:   If a small area of the tooth has been destroyed, the destroyed area will be removed and cavities will be filled with a material such as gold, silver amalgam, or composite resin.   If a large area of the tooth has been destroyed, the destroyed area will be removed and a cap (crown) will be fitted over the remaining tooth structure.   If the center part of the tooth (pulp) is affected, a procedure called a root canal will be needed before a filling or crown can be placed.   If most of the tooth has been destroyed, the tooth may need to be pulled (extracted). HOME CARE INSTRUCTIONS You can prevent, stop, or reverse dental caries at home by practicing good oral hygiene. Good oral hygiene includes:  Thoroughly cleaning your teeth at least twice a day with a toothbrush and dental floss.   Using a fluoride toothpaste. A fluoride mouth rinse may also be used if recommended by your dentist or health care provider.   Restricting the amount of sugary and starchy foods and sugary liquids you consume.   Avoiding frequent snacking on these foods and sipping of these liquids.   Keeping regular  visits with a dentist for checkups and cleanings. PREVENTION   Practice good oral hygiene.  Consider a dental sealant. A dental sealant is a coating material that is applied by your dentist to the pits and grooves of teeth. The sealant prevents food from being trapped in them. It may protect the teeth for several years.  Ask about fluoride supplements if you live in a community without fluorinated water or with water that has a low fluoride content. Use fluoride supplements as directed by your dentist or health care provider.  Allow fluoride varnish applications to teeth if directed by your dentist or health care provider.   This information is not intended to replace advice given to you by your health care provider. Make sure you discuss any questions you have with your health care provider.   Document Released: 09/27/2001 Document Revised: 01/26/2014 Document Reviewed: 01/08/2012 Elsevier Interactive Patient Education 2016 [place] and Dentist Visits Dental care supports good overall health. Regular dental visits can also help you avoid dental pain, bleeding, infection,  and other more serious health problems in the future. It is important to keep the mouth healthy because diseases in the teeth, gums, and other oral tissues can spread to other areas of the body. Some problems, such as diabetes, heart disease, and pre-term labor have been associated with poor oral health.  See your dentist every 6 months. If you experience emergency problems such as a toothache or broken tooth, go to the dentist right away. If you see your dentist regularly, you may catch problems early. It is easier to be treated for problems in the early stages.  WHAT TO EXPECT AT A DENTIST VISIT  Your dentist will look for many common oral health problems and recommend proper treatment. At your regular dental visit, you can expect:  Gentle cleaning of the teeth and gums. This includes scraping and  polishing. This helps to remove the sticky substance around the teeth and gums (plaque). Plaque forms in the mouth shortly after eating. Over time, plaque hardens on the teeth as tartar. If tartar is not removed regularly, it can cause problems. Cleaning also helps remove stains.  Periodic X-rays. These pictures of the teeth and supporting bone will help your dentist assess the health of your teeth.  Periodic fluoride treatments. Fluoride is a natural mineral shown to help strengthen teeth. Fluoride treatmentinvolves applying a fluoride gel or varnish to the teeth. It is most commonly done in children.  Examination of the mouth, tongue, jaws, teeth, and gums to look for any oral health problems, such as:  Cavities (dental caries). This is decay on the tooth caused by plaque, sugar, and acid in the mouth. It is best to catch a cavity when it is small.  Inflammation of the gums caused by plaque buildup (gingivitis).  Problems with the mouth or malformed or misaligned teeth.  Oral cancer or other diseases of the soft tissues or jaws. KEEP YOUR TEETH AND GUMS HEALTHY For healthy teeth and gums, follow these general guidelines as well as your dentist's specific advice:  Have your teeth professionally cleaned at the dentist every 6 months.  Brush twice daily with a fluoride toothpaste.  Floss your teeth daily.  Ask your dentist if you need fluoride supplements, treatments, or fluoride toothpaste.  Eat a healthy diet. Reduce foods and drinks with added sugar.  Avoid smoking. TREATMENT FOR ORAL HEALTH PROBLEMS If you have oral health problems, treatment varies depending on the conditions present in your teeth and gums.  Your caregiver will most likely recommend good oral hygiene at each visit.  For cavities, gingivitis, or other oral health disease, your caregiver will perform a procedure to treat the problem. This is typically done at a separate appointment. Sometimes your caregiver  will refer you to another dental specialist for specific tooth problems or for surgery. SEEK IMMEDIATE DENTAL CARE IF:  You have pain, bleeding, or soreness in the gum, tooth, jaw, or mouth area.  A permanent tooth becomes loose or separated from the gum socket.  You experience a blow or injury to the mouth or jaw area.   This information is not intended to replace advice given to you by your health care provider. Make sure you discuss any questions you have with your health care provider.   Document Released: 09/17/2010 Document Revised: 03/30/2011 Document Reviewed: 09/17/2010 Elsevier Interactive Patient Education 2016 [place] [facility]  [place] [facility] and [place] [email or web address]   [facility]  Dental Clinic [phone])  Charlsie Quest [phone])  [place] [phone])  [place] [phone])  [facility] [phone]) This clinic caters to the indigent population and is on a lottery system. Location: [employer] [facility], [place], [place], [place] Clinic Hours: Wednesdays from 6pm - 9pm, patients seen by a lottery system. For dates, call or go to [email or web address] Services: Cleanings, fillings and simple extractions. Payment Options: DENTAL WORK IS FREE OF CHARGE. Bring proof of income or support. Best way to get seen: Arrive at 5:15 pm - this is a lottery, NOT first come/first serve, so arriving earlier will not increase your chances of being seen.     [facility] [phone] Select option 1 for emergencies   Location: [facility], [place], [address] [place] Clinic Hours: No walk-ins accepted - call the day before to schedule an appointment. Check in times are 9:30  am and 1:30 pm. Services: Simple extractions, temporary fillings, pulpectomy/pulp debridement, uncomplicated abscess drainage. Payment Options: PAYMENT IS DUE AT THE TIME OF SERVICE.  Fee is usually $100-200, additional surgical procedures (e.g. abscess drainage) may be extra. Cash, checks, Visa/MasterCard accepted.  Can file Medicaid if patient is covered for dental - patient should call case worker to check. No discount for [facility] patients. Best way to get seen: MUST call the day before and get onto the schedule. Can usually be seen the next 1-2 days. No walk-ins accepted.     [place] [phone]   Location: [place], [place] Clinic Hours: M, W, Th, F 8am or 1:30pm, Tues 9a or 1:30 - first come/first served. Services: Simple extractions, temporary fillings, uncomplicated abscess drainage.  You do not need to be an [facility] resident. Payment Options: PAYMENT IS DUE AT THE TIME OF SERVICE. Dental insurance, otherwise sliding scale - bring proof of income or support. Depending on income and treatment needed, cost is usually $50-200. Best way to get seen: Arrive early as it is first come/first served.     [facility] [phone]   Location: [place] [facility] Hours: Mon-Thu 8a-5p Services: Most basic dental services including extractions and fillings. Payment Options: PAYMENT IS DUE AT THE TIME OF SERVICE. Sliding scale, up to 50% off - bring proof if income or support. Medicaid with dental option accepted. Best way to get seen: Call to schedule an appointment, can usually be seen within 2 weeks OR they will try to see walk-ins - show up at [place] or 2p (you may have to wait).     [facility] [place] RESIDENTS ONLY   Location: [facility], [place] [address] [place], [place] [address] Clinic Hours: By  appointment only. Monday - Thursday 8am-5pm, Friday 8am-12pm Services: Cleanings, fillings, extractions. Payment Options: PAYMENT IS DUE AT THE TIME OF SERVICE. Cash, Visa or MasterCard. Sliding scale - $30 minimum per service. Best way to get seen: Come in to office, complete packet and make an appointment - need proof of income or support monies for each household member and proof of [facility] residence. Usually takes about a month to get in.     [facility] [phone]   Location: [address] [place] Clinic Hours: Walk-in Urgent Care Dental Services are offered Monday-Friday mornings only. The numbers of emergencies accepted daily is limited to the number  of providers available. Maximum 15 - Mondays, Wednesdays & Thursdays Maximum 10 - Tuesdays & Fridays Services: You do not need to be a [facility] resident to be seen for a dental emergency. Emergencies are defined as pain, swelling, abnormal bleeding, or dental trauma. Walkins will receive x-rays if needed. NOTE: Dental cleaning is not an emergency. Payment Options: PAYMENT IS DUE AT THE TIME OF SERVICE. Minimum co-pay is $40.00 for uninsured patients. Minimum co-pay is $3.00 for Medicaid with dental coverage. Dental Insurance is accepted and must be presented at time of visit. Medicare does not cover dental. Forms of payment: Cash, credit card, checks. Best way to get seen: If not previously registered with the clinic, walk-in dental registration begins at 7:15 am and is on a first come/first serve basis. If previously registered with the clinic, call to make an appointment.     [facility] [place] ONLY   Location: [address], [place], [place] Clinic Hours: Mon-Thu 10a-2p Services: Extractions only! Payment Options: FREE (donations accepted) - bring proof of income or support Best way to get seen: Call and schedule an  appointment OR come at 8am on the 1st Monday of every month (except for holidays) when it is first come/first served.     [facility] [phone]   Location: [place], [place] [facility] Hours: Friday mornings Services, Payment Options, Best way to get seen: Call for info
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
## 2015-05-28 ENCOUNTER — Emergency Department
Admission: EM | Admit: 2015-05-28 | Discharge: 2015-05-28 | Disposition: A | Discharge status: Home / Self Care | Payer: Self-pay | Attending: Emergency Medicine | Admitting: Emergency Medicine

## 2015-05-28 ENCOUNTER — Encounter: Payer: Self-pay | Admitting: Emergency Medicine

## 2015-05-28 ENCOUNTER — Emergency Department: Payer: Self-pay

## 2015-05-28 DIAGNOSIS — R079 Chest pain, unspecified: Secondary | ICD-10-CM | POA: Insufficient documentation

## 2015-05-28 DIAGNOSIS — Z91010 Allergy to peanuts: Secondary | ICD-10-CM | POA: Insufficient documentation

## 2015-05-28 DIAGNOSIS — J45909 Unspecified asthma, uncomplicated: Secondary | ICD-10-CM | POA: Insufficient documentation

## 2015-05-28 DIAGNOSIS — Z79899 Other long term (current) drug therapy: Secondary | ICD-10-CM | POA: Insufficient documentation

## 2015-05-28 DIAGNOSIS — F17210 Nicotine dependence, cigarettes, uncomplicated: Secondary | ICD-10-CM | POA: Insufficient documentation

## 2015-05-28 LAB — BASIC METABOLIC PANEL
ANION GAP: 11 (ref 5–15)
BUN: 8 mg/dL (ref 6–20)
CHLORIDE: 108 mmol/L (ref 101–111)
CO2: 19 mmol/L — ABNORMAL LOW (ref 22–32)
CREATININE: 0.86 mg/dL (ref 0.44–1.00)
Calcium: 9.2 mg/dL (ref 8.9–10.3)
GFR calc non Af Amer: >60 mL/min (ref >60)
Glucose, Bld: 96 mg/dL (ref 65–99)
Potassium: 3.6 mmol/L (ref 3.5–5.1)
Sodium: 138 mmol/L (ref 135–145)

## 2015-05-28 LAB — CBC
HCT: 38.3 % (ref 35.0–47.0)
Hemoglobin: 12.7 g/dL (ref 12.0–16.0)
MCH: 30.5 pg (ref 26.0–34.0)
MCHC: 33.1 g/dL (ref 32.0–36.0)
MCV: 91.9 fL (ref 80.0–100.0)
PLATELETS: 288 10*3/uL (ref 150–440)
RBC: 4.17 MIL/uL (ref 3.80–5.20)
RDW: 17 % — ABNORMAL HIGH (ref 11.5–14.5)
WBC: 6.4 10*3/uL (ref 3.6–11.0)

## 2015-05-28 LAB — TROPONIN I: Troponin I: <0.03 ng/mL (ref <0.031)

## 2015-05-28 MED ORDER — IBUPROFEN 600 MG PO TABS: 600.0000 mg | ORAL_TABLET | Freq: Three times a day (TID) | ORAL | Status: DC | PRN

## 2015-05-28 MED ORDER — KETOROLAC TROMETHAMINE 30 MG/ML IJ SOLN
30.0000 mg | Freq: Once | INTRAMUSCULAR | Status: AC
  Administered 2015-05-28: 30 mg via INTRAVENOUS
  Filled 2015-05-28: qty 1

## 2015-05-28 NOTE — ED Notes (Signed)
Pt presents with chest pain since 1430. Pt states it feels like stabbing sometimes and burning sometimes. States she has never had pain like this before and it radiates to back. Pt tearful during triage.

## 2015-05-28 NOTE — ED Provider Notes (Signed)
[facility] Emergency Department Provider Note  ____________________________________________  Time seen: ~1805  I have reviewed the triage vital signs and the nursing notes.   HISTORY  Chief Complaint Chest Pain   History limited by: Not Limited   HPI Carolyn Bryant is a 35 y.o. female who presents to the emergency department today because of concerns for chest pain. She states it is located in her left chest. Started with this afternoon when she picked up her son from school. Since that time it has been on. It is worse with coughing and deep breaths. She describes it as sharp. It radiates into her back. She also had some diaphoresis. She denies similar symptoms in the past. No recent fevers. Patient denies any drug use. Denies being on any estrogen containing medications. States she does smoke.    Past Medical History  Diagnosis Date  . Asthma     There are no active problems to display for this patient.   Past Surgical History  Procedure Laterality Date  . Tubal ligation      Current Outpatient Rx  Name  Route  Sig  Dispense  Refill  . penicillin v potassium (VEETID) 500 MG tablet   Oral   Take 1 tablet (500 mg total) by mouth 4 (four) times daily.   40 tablet   0   . traMADol (ULTRAM) 50 MG tablet   Oral   Take 1 tablet (50 mg total) by mouth 2 (two) times daily.   10 tablet   0   . trimethoprim-polymyxin b (POLYTRIM) ophthalmic solution   Right Eye   Place 2 drops into the right eye every 6 (six) hours.   10 mL   0     Allergies Peanuts and Prednisone  History reviewed. No pertinent family history.  Social History Social History  Substance Use Topics  . Smoking status: Current Every Day Smoker -- 0.10 packs/day    Types: Cigarettes  . Smokeless tobacco: None  . Alcohol Use: Yes     Comment: occasional    Review of Systems  Constitutional: Negative for fever. Cardiovascular: Positive for left chest  pain. Respiratory: Negative for shortness of breath. Gastrointestinal: Negative for abdominal pain, vomiting and diarrhea. Neurological: Negative for headaches, focal weakness or numbness.  10-point ROS otherwise negative.  ____________________________________________   PHYSICAL EXAM:  VITAL SIGNS: ED Triage Vitals  Enc Vitals Group     BP 05/28/15 1731 158/101 mmHg     Pulse Rate 05/28/15 1731 80     Resp 05/28/15 1731 20     Temp 05/28/15 1731 98.6 F (37 C)     Temp Source 05/28/15 1731 Oral     SpO2 05/28/15 1731 100 %     Weight 05/28/15 1731 138 lb (62.596 kg)     Height 05/28/15 1731 5\' 2"  (1.575 m)     Head Cir --      Peak Flow --      Pain Score 05/28/15 1732 10   Constitutional: Alert and oriented. Appears mildly uncomfortable Eyes: Conjunctivae are normal. PERRL. Normal extraocular movements. ENT   Head: Normocephalic and atraumatic.   Nose: No congestion/rhinnorhea.   Mouth/Throat: Mucous membranes are moist.   Neck: No stridor. Hematological/Lymphatic/Immunilogical: No cervical lymphadenopathy. Cardiovascular: Normal rate, regular rhythm.  No murmurs, rubs, or gallops. Tender to palpation over the left chest. Patient states this does reproduce the pain she was experiencing. Respiratory: Normal respiratory effort without tachypnea nor retractions. Breath sounds are clear  and equal bilaterally. No wheezes/rales/rhonchi. Gastrointestinal: Soft and nontender. No distention.  Genitourinary: Deferred Musculoskeletal: Normal range of motion in all extremities. No joint effusions.  No lower extremity tenderness nor edema. Neurologic:  Normal speech and language. No gross focal neurologic deficits are appreciated.  Skin:  Skin is warm, dry and intact. No rash noted. Psychiatric: Mood and affect are normal. Speech and behavior are normal. Patient exhibits appropriate insight and judgment.  ____________________________________________    LABS (pertinent  positives/negatives) Labs Reviewed  BASIC METABOLIC PANEL - Abnormal; Notable for the following:    CO2 19 (*)    All other components within normal limits  CBC - Abnormal; Notable for the following:    RDW 17.0 (*)    All other components within normal limits  TROPONIN I  TROPONIN I     ____________________________________________   EKG  I, Nance Pear, attending physician, personally viewed and interpreted this EKG  EKG Time: 1728 Rate: 85 Rhythm: normal sinus rhythm Axis: normal Intervals: qtc 411 QRS: narrow ST changes: no st elevation Impression: normal ekg  Read somewhat limited secondary to artifact.   ____________________________________________    RADIOLOGY  CXR IMPRESSION: Minimal chronic peribronchial thickening which could reflect bronchitis or asthma.  No acute infiltrate.  ____________________________________________   PROCEDURES  Procedure(s) performed: None  Critical Care performed: No  ____________________________________________   INITIAL IMPRESSION / ASSESSMENT AND PLAN / ED COURSE  Pertinent labs & imaging results that were available during my care of the patient were reviewed by me and considered in my medical decision making (see chart for details).  Patient presented to the emergency department today because of concerns for chest pain. EKG without any concerning findings. Chest x-ray without any signs of pneumonia. No widening of the mediastinum. This point I think patient is a low risk for PE. She does smoke however no other significant risk factors. Additionally think low risk of ACS. Will check blood work.  ----------------------------------------- 7:36 PM on 05/28/2015 -----------------------------------------  Patient's initial troponin negative. Patient does state she feels better after the pain medication. Will plan on checking a second Trop out of an abundance of  caution.  ----------------------------------------- 9:54 PM on 05/28/2015 -----------------------------------------  Second troponin negative. Patient states she continues to feel better. At this point doubt ACS given 2 negative troponins. Think potentially costochondritis given the patient was tender to palpation of the chest wall. Discharge home with high-dose ibuprofen. Additionally encourage patient to follow-up with primary care and discussed return precautions.  ____________________________________________   FINAL CLINICAL IMPRESSION(S) / ED DIAGNOSES  Final diagnoses:  Chest pain, unspecified chest pain type     Nance Pear, MD 05/28/15 2154

## 2015-05-28 NOTE — Discharge Instructions (Signed)
Please seek medical attention for any high fevers, chest pain, shortness of breath, change in behavior, persistent vomiting, bloody stool or any other new or concerning symptoms. ° ° °Nonspecific Chest Pain °It is often hard to find the cause of chest pain. There is always a chance that your pain could be related to something serious, such as a heart attack or a blood clot in your lungs. Chest pain can also be caused by conditions that are not life-threatening. If you have chest pain, it is very important to follow up with your doctor. ° °HOME CARE °· If you were prescribed an antibiotic medicine, finish it all even if you start to feel better. °· Avoid any activities that cause chest pain. °· Do not use any tobacco products, including cigarettes, chewing tobacco, or electronic cigarettes. If you need help quitting, ask your doctor. °· Do not drink alcohol. °· Take medicines only as told by your doctor. °· Keep all follow-up visits as told by your doctor. This is important. This includes any further testing if your chest pain does not go away. °· Your doctor may tell you to keep your head raised (elevated) while you sleep. °· Make lifestyle changes as told by your doctor. These may include: °¨ Getting regular exercise. Ask your doctor to suggest some activities that are safe for you. °¨ Eating a heart-healthy diet. Your doctor or a diet specialist (dietitian) can help you to learn healthy eating options. °¨ Maintaining a healthy weight. °¨ Managing diabetes, if necessary. °¨ Reducing stress. °GET HELP IF: °· Your chest pain does not go away, even after treatment. °· You have a rash with blisters on your chest. °· You have a fever. °GET HELP RIGHT AWAY IF: °· Your chest pain is worse. °· You have an increasing cough, or you cough up blood. °· You have severe belly (abdominal) pain. °· You feel extremely weak. °· You pass out (faint). °· You have chills. °· You have sudden, unexplained chest discomfort. °· You have  sudden, unexplained discomfort in your arms, back, neck, or jaw. °· You have shortness of breath at any time. °· You suddenly start to sweat, or your skin gets clammy. °· You feel nauseous. °· You vomit. °· You suddenly feel light-headed or dizzy. °· Your heart begins to beat quickly, or it feels like it is skipping beats. °These symptoms may be an emergency. Do not wait to see if the symptoms will go away. Get medical help right away. Call your local emergency services (911 in the [place] Do not drive yourself to the hospital. °  °This information is not intended to replace advice given to you by your health care provider. Make sure you discuss any questions you have with your health care provider. °  °Document Released: 06/24/2007 Document Revised: 01/26/2014 Document Reviewed: 08/11/2013 °Elsevier Interactive Patient Education ©2016 Elsevier Inc. ° °

## 2015-06-25 ENCOUNTER — Emergency Department
Admission: EM | Admit: 2015-06-25 | Discharge: 2015-06-25 | Disposition: A | Discharge status: Home / Self Care | Payer: Self-pay | Attending: Emergency Medicine | Admitting: Emergency Medicine

## 2015-06-25 DIAGNOSIS — H109 Unspecified conjunctivitis: Secondary | ICD-10-CM

## 2015-06-25 DIAGNOSIS — H1089 Other conjunctivitis: Secondary | ICD-10-CM | POA: Insufficient documentation

## 2015-06-25 DIAGNOSIS — Z91010 Allergy to peanuts: Secondary | ICD-10-CM | POA: Insufficient documentation

## 2015-06-25 DIAGNOSIS — J45909 Unspecified asthma, uncomplicated: Secondary | ICD-10-CM | POA: Insufficient documentation

## 2015-06-25 DIAGNOSIS — Z79899 Other long term (current) drug therapy: Secondary | ICD-10-CM | POA: Insufficient documentation

## 2015-06-25 DIAGNOSIS — F17210 Nicotine dependence, cigarettes, uncomplicated: Secondary | ICD-10-CM | POA: Insufficient documentation

## 2015-06-25 DIAGNOSIS — Z792 Long term (current) use of antibiotics: Secondary | ICD-10-CM | POA: Insufficient documentation

## 2015-06-25 MED ORDER — POLYMYXIN B-TRIMETHOPRIM 10000-0.1 UNIT/ML-% OP SOLN: 1.0000 [drp] | Freq: Four times a day (QID) | OPHTHALMIC | Status: DC

## 2015-06-25 NOTE — ED Provider Notes (Signed)
[facility] Emergency Department Provider Note  ____________________________________________  Time seen: Approximately 9:18 PM  I have reviewed the triage vital signs and the nursing notes.   HISTORY  Chief Complaint Eye Pain    HPI Carolyn Bryant is a 35 y.o. female , NAD, presents to the emergency department with 2 day history of right eye itching, irritation and drainage. Had a URI about 2 weeks ago that has resolved. Itching about the right eye began 2 days ago and has progressed to discharge over the last 24 hours. Notes it worsens when she sleeps, then wakes with right eye matted shut. Denies fevers, chills, body aches. Has not had any nasal congestion, runny nose, ear pain, sore throat recently. Denies any changes in vision or injury or trauma to the eye.   Past Medical History  Diagnosis Date  . Asthma     There are no active problems to display for this patient.   Past Surgical History  Procedure Laterality Date  . Tubal ligation      Current Outpatient Rx  Name  Route  Sig  Dispense  Refill  . ibuprofen (ADVIL,MOTRIN) 600 MG tablet   Oral   Take 1 tablet (600 mg total) by mouth every 8 (eight) hours as needed.   20 tablet   0   . penicillin v potassium (VEETID) 500 MG tablet   Oral   Take 1 tablet (500 mg total) by mouth 4 (four) times daily.   40 tablet   0   . traMADol (ULTRAM) 50 MG tablet   Oral   Take 1 tablet (50 mg total) by mouth 2 (two) times daily.   10 tablet   0   . trimethoprim-polymyxin b (POLYTRIM) ophthalmic solution   Both Eyes   Place 1 drop into both eyes every 6 (six) hours.   10 mL   0     Allergies Peanuts and Prednisone  No family history on file.  Social History Social History  Substance Use Topics  . Smoking status: Current Every Day Smoker -- 0.10 packs/day    Types: Cigarettes  . Smokeless tobacco: Not on file  . Alcohol Use: Yes     Comment: occasional     Review of Systems   Constitutional: No fever/chills Eyes: Positive discharge, itching and redness to the right eye. Left eye without any abnormalities. No visual changes.  ENT: No sore throat, nasal congestion, ear pain, ear drainage, sinus pressure. Cardiovascular: No chest pain. Respiratory: No cough. No shortness of breath.  Musculoskeletal: Negative for general myalgias.  Skin: Negative for rash. Neurological: Negative for headaches, focal weakness or numbness. 10-point ROS otherwise negative.  ____________________________________________   PHYSICAL EXAM:  VITAL SIGNS: ED Triage Vitals  Enc Vitals Group     BP 06/25/15 2101 137/90 mmHg     Pulse Rate 06/25/15 2100 92     Resp 06/25/15 2100 18     Temp 06/25/15 2100 98.8 F (37.1 C)     Temp Source 06/25/15 2100 Oral     SpO2 06/25/15 2100 100 %     Weight 06/25/15 2100 140 lb (63.504 kg)     Height 06/25/15 2100 5\' 2"  (1.575 m)     Head Cir --      Peak Flow --      Pain Score 06/25/15 2101 5     Pain Loc --      Pain Edu? --      Excl. in [place]? --  Constitutional: Alert and oriented. Well appearing and in no acute distress. Eyes: Clear, mucoid discharge noted from the right eye with some yellow crusting in the corner. Conjunctivae are normal. PERRLA. EOMI without pain.  Head: Atraumatic. ENT:           Nose: No congestion/rhinnorhea.      Mouth/Throat: Mucous membranes are moist.  Neck: Supple with full range of motion Hematological/Lymphatic/Immunilogical: No cervical lymphadenopathy. Respiratory: Normal respiratory effort without tachypnea or retractions. Neurologic:  Normal speech and language. No gross focal neurologic deficits are appreciated. Gait and posture are normal Skin:  Skin is warm, dry and intact. No rash noted. Psychiatric: Mood and affect are normal. Speech and behavior are normal. Patient exhibits appropriate insight and judgement.   ____________________________________________    LABS  None ____________________________________________  EKG  None ____________________________________________  RADIOLOGY  None ____________________________________________    PROCEDURES  Procedure(s) performed: None    Medications - No data to display   ____________________________________________   INITIAL IMPRESSION / ASSESSMENT AND PLAN / ED COURSE  Patient's diagnosis is consistent with bacterial conjunctivitis of the right eye. Patient will be discharged home with prescriptions for Polytrim eyedrops to use as directed. Patient is to follow up with her primary care provider or [facility] if symptoms persist past this treatment course. Patient is given ED precautions to return to the ED for any worsening or new symptoms.    ____________________________________________  FINAL CLINICAL IMPRESSION(S) / ED DIAGNOSES  Final diagnoses:  Bacterial conjunctivitis of right eye      NEW MEDICATIONS STARTED DURING THIS VISIT:  Discharge Medication List as of 06/25/2015  9:23 PM           Braxton Feathers, PA-C 06/25/15 2221  Nance Pear, MD 06/25/15 2245

## 2015-06-25 NOTE — ED Notes (Signed)
Pt in with co right eye pain and drainage x 2 days, states also itching.

## 2015-06-25 NOTE — Discharge Instructions (Signed)
Bacterial Conjunctivitis °Bacterial conjunctivitis (commonly called pink eye) is redness, soreness, or puffiness (inflammation) of the white part of your eye. It is caused by a germ called bacteria. These germs can easily spread from person to person (contagious). Your eye often will become red or pink. Your eye may also become irritated, watery, or have a thick discharge.  °HOME CARE  °1. Apply a cool, clean washcloth over closed eyelids. Do this for 10-20 minutes, 3-4 times a day while you have pain. °2. Gently wipe away any fluid coming from the eye with a warm, wet washcloth or cotton ball. °3. Wash your hands often with soap and water. Use paper towels to dry your hands. °4. Do not share towels or washcloths. °5. Change or wash your pillowcase every day. °6. Do not use eye makeup until the infection is gone. °7. Do not use machines or drive if your vision is blurry. °8. Stop using contact lenses. Do not use them again until your doctor says it is okay. °9. Do not touch the tip of the eye drop bottle or medicine tube with your fingers when you put medicine on the eye. °GET HELP RIGHT AWAY IF:  °1. Your eye is not better after 3 days of starting your medicine. °2. You have a yellowish fluid coming out of the eye. °3. You have more pain in the eye. °4. Your eye redness is spreading. °5. Your vision becomes blurry. °6. You have a fever or lasting symptoms for more than 2-3 days. °7. You have a fever and your symptoms suddenly get worse. °8. You have pain in the face. °9. Your face gets red or puffy (swollen). °MAKE SURE YOU:  °· Understand these instructions. °· Will watch this condition. °· Will get help right away if you are not doing well or get worse. °  °This information is not intended to replace advice given to you by your health care provider. Make sure you discuss any questions you have with your health care provider. °  °Document Released: 10/15/2007 Document Revised: 12/23/2011 Document Reviewed:  09/11/2011 °Elsevier Interactive Patient Education ©2016 Elsevier Inc. ° °How to Use Eye Drops and Eye Ointments °HOW TO APPLY EYE DROPS °Follow these steps when applying eye drops: °10. Wash your hands. °11. Tilt your head back. °12. Put a finger under your eye and use it to gently pull your lower lid downward. Keep that finger in place. °13. Using your other hand, hold the dropper between your thumb and index finger. °14. Position the dropper just over the edge of the lower lid. Hold it as close to your eye as you can without touching the dropper to your eye. °15. Steady your hand. One way to do this is to lean your index finger against your brow. °16. Look up. °17. Slowly and gently squeeze one drop of medicine into your eye. °18. Close your eye. °19. Place a finger between your lower eyelid and your nose. Press gently for 2 minutes. This increases the amount of time that the medicine is exposed to the eye. It also reduces side effects that can develop if the drop gets into the bloodstream through the nose. °HOW TO APPLY EYE OINTMENTS °Follow these steps when applying eye ointments: °10. Wash your hands. °11. Put a finger under your eye and use it to gently pull your lower lid downward. Keep that finger in place. °12. Using your other hand, place the tip of the tube between your thumb and index finger with   the remaining fingers braced against your cheek or nose. °13. Hold the tube just over the edge of your lower lid without touching the tube to your lid or eyeball. °14. Look up. °15. Line the inner part of your lower lid with ointment. °16. Gently pull up on your upper lid and look down. This will force the ointment to spread over the surface of the eye. °17. Release the upper lid. °18. If you can, close your eyes for 1-2 minutes. °Do not rub your eyes. If you applied the ointment correctly, your vision will be blurry for a few minutes. This is normal. °ADDITIONAL INFORMATION °· Make sure to use the eye drops or  ointment as told by your health care provider. °· If you have been told to use both eye drops and an eye ointment, apply the eye drops first, then wait 3-4 minutes before you apply the ointment. °· Try not to touch the tip of the dropper or tube to your eye. A dropper or tube that has touched the eye can become contaminated. °  °This information is not intended to replace advice given to you by your health care provider. Make sure you discuss any questions you have with your health care provider. °  °Document Released: 04/13/2000 Document Revised: 05/22/2014 Document Reviewed: 01/01/2014 °Elsevier Interactive Patient Education ©2016 Elsevier Inc. ° °

## 2015-09-19 ENCOUNTER — Encounter: Payer: Self-pay | Admitting: Emergency Medicine

## 2015-09-19 ENCOUNTER — Emergency Department: Payer: Self-pay

## 2015-09-19 ENCOUNTER — Emergency Department
Admission: EM | Admit: 2015-09-19 | Discharge: 2015-09-19 | Disposition: A | Discharge status: Home / Self Care | Payer: Self-pay | Attending: Emergency Medicine | Admitting: Emergency Medicine

## 2015-09-19 DIAGNOSIS — J45909 Unspecified asthma, uncomplicated: Secondary | ICD-10-CM | POA: Insufficient documentation

## 2015-09-19 DIAGNOSIS — F17210 Nicotine dependence, cigarettes, uncomplicated: Secondary | ICD-10-CM | POA: Insufficient documentation

## 2015-09-19 DIAGNOSIS — M25561 Pain in right knee: Secondary | ICD-10-CM | POA: Insufficient documentation

## 2015-09-19 MED ORDER — IBUPROFEN 800 MG PO TABS: 800.0000 mg | ORAL_TABLET | Freq: Three times a day (TID) | ORAL | 0 refills | Status: DC | PRN

## 2015-09-19 MED ORDER — TRAMADOL HCL 50 MG PO TABS: 50.0000 mg | ORAL_TABLET | Freq: Four times a day (QID) | ORAL | 0 refills | Status: DC | PRN

## 2015-09-19 MED ORDER — IBUPROFEN 800 MG PO TABS
800.0000 mg | ORAL_TABLET | Freq: Once | ORAL | Status: AC
  Administered 2015-09-19: 800 mg via ORAL
  Filled 2015-09-19: qty 1

## 2015-09-19 NOTE — ED Provider Notes (Signed)
[facility] Emergency Department Provider Note  ____________________________________________  Time seen: Approximately 7:37 PM  I have reviewed the triage vital signs and the nursing notes.   HISTORY  Chief Complaint Knee Pain    HPI Carolyn Bryant is a 35 y.o. female who presents with proximal to 4 days of increasing pain to the knees right greater than left. No particular injury. She has been active but does not remember any trauma. She does have pain with range of motion. She had some mild swelling that has improved. No redness. No history of gout.No history of sickle cell   Past Medical History:  Diagnosis Date  . Asthma     There are no active problems to display for this patient.   Past Surgical History:  Procedure Laterality Date  . TUBAL LIGATION      Current Outpatient Rx  . Order #: [id number] Class: Print  . Order #: [id number] Class: Print  . Order #: [id number] Class: Print    Allergies Peanuts [peanut oil] and Prednisone  History reviewed. No pertinent family history.  Social History Social History  Substance Use Topics  . Smoking status: Current Every Day Smoker    Packs/day: 0.10    Types: Cigarettes  . Smokeless tobacco: Not on file  . Alcohol use Yes     Comment: occasional    Review of Systems Constitutional: No fever/chills Eyes: No visual changes. ENT: No sore throat. Cardiovascular: Denies chest pain. Respiratory: Denies shortness of breath. Gastrointestinal: No abdominal pain.  No nausea, no vomiting.  No diarrhea.  No constipation. Genitourinary: Negative for dysuria. Musculoskeletal: Negative for back pain. Skin: Negative for rash. Neurological: Negative for headaches, focal weakness or numbness. 10-point ROS otherwise negative.  ____________________________________________   PHYSICAL EXAM:  VITAL SIGNS: ED Triage Vitals [09/19/15 1842]  Enc Vitals Group     BP 130/83     Pulse Rate 68     Resp 16      Temp 98.1 F (36.7 C)     Temp Source Oral     SpO2 100 %     Weight 150 lb (68 kg)     Height 5\' 3"  (1.6 m)     Head Circumference      Peak Flow      Pain Score 8     Pain Loc      Pain Edu?      Excl. in [place]?     Constitutional: Alert and oriented. Well appearing and in no acute distress. Eyes: Conjunctivae are normal. PERRL. EOMI. Mouth/Throat: Mucous membranes are moist.  Oropharynx non-erythematous. No lesions. Neck:  Supple. Cardiovascular: Normal rate, regular rhythm. Grossly normal heart sounds.  Good peripheral circulation. Respiratory: Normal respiratory effort.  No retractions. Lungs CTAB. Gastrointestinal: Soft and nontender. No distention. No abdominal bruits. No CVA tenderness. Musculoskeletal:Right knee. The swelling. Tender over the patella, and joint lines medial and lateral. Pain with range of motion. Mild laxity on valgus varus stress as well as Lachman's. Neurologic:  Normal speech and language. No gross focal neurologic deficits are appreciated. No gait instability. Skin:  Skin is warm, dry and intact. No rash noted. Psychiatric: Mood and affect are normal. Speech and behavior are normal.  ____________________________________________   LABS (all labs ordered are listed, but only abnormal results are displayed)  Labs Reviewed - No data to display ____________________________________________  EKG   ____________________________________________  RADIOLOGY  CLINICAL DATA:  Right knee pain for 3 days. No known injury. Initial encounter.  EXAM:  RIGHT KNEE - COMPLETE 4+ VIEW  COMPARISON:  None.  FINDINGS: No fracture, subluxation or dislocation identified.  No joint effusion is noted.  The joint space is unremarkable.  No suspicious focal bony lesions identified.  IMPRESSION: Negative.   Electronically Signed   By: Margarette Canada M.D.   On: 09/19/2015  19:59  ____________________________________________   PROCEDURES  Procedure(s) performed: None  Critical Care performed: No  ____________________________________________   INITIAL IMPRESSION / ASSESSMENT AND PLAN / ED COURSE  Pertinent labs & imaging results that were available during my care of the patient were reviewed by me and considered in my medical decision making (see chart for details).  35 year old with acute pain to the right knee without trauma. Stable x-rays in the ER. Consider strain/sprain versus a monoarticular arthritis such as gout. Low suspicion for septic joint. Given ibuprofen and a few tramadol. She will apply ice and Ace wrap. Can follow-up with her physician or orthopedist for further evaluation. ____________________________________________   FINAL CLINICAL IMPRESSION(S) / ED DIAGNOSES  Final diagnoses:  Right knee pain      Mortimer Fries, PA-C 09/19/15 2015    Rudene Re, MD 09/20/15 1319

## 2015-09-19 NOTE — Discharge Instructions (Signed)
Take pain medicine as directed. Follow-up with [facility] or the orthopedist if not improving. Return to the emergency room for any concerns.

## 2015-09-19 NOTE — ED Triage Notes (Signed)
C/o bilateral knee pain that started Monday. Reports right knee swelling today. Both knees ache today but the right is the one bothering her more.

## 2015-09-19 NOTE — ED Notes (Signed)
Ice pack applied. Instructions given to use ice pack for no more than 20 minutes at a time. Patient verbalized understanding.

## 2015-09-19 NOTE — ED Notes (Signed)
See triage note   States she noticed some pain to leg on Monday .Marland Kitchen But on Tuesday noticed pain to both lower legs  But this am states the pain to right leg has increased   No swelling noted positive pulses   But area is tender on palpation

## 2015-12-05 ENCOUNTER — Encounter: Payer: Self-pay | Admitting: *Deleted

## 2015-12-05 ENCOUNTER — Emergency Department
Admission: EM | Admit: 2015-12-05 | Discharge: 2015-12-05 | Disposition: A | Discharge status: Home / Self Care | Payer: BLUE CROSS/BLUE SHIELD | Attending: Emergency Medicine | Admitting: Emergency Medicine

## 2015-12-05 DIAGNOSIS — B354 Tinea corporis: Secondary | ICD-10-CM | POA: Insufficient documentation

## 2015-12-05 DIAGNOSIS — Z791 Long term (current) use of non-steroidal anti-inflammatories (NSAID): Secondary | ICD-10-CM | POA: Insufficient documentation

## 2015-12-05 DIAGNOSIS — J45909 Unspecified asthma, uncomplicated: Secondary | ICD-10-CM | POA: Diagnosis not present

## 2015-12-05 DIAGNOSIS — F17210 Nicotine dependence, cigarettes, uncomplicated: Secondary | ICD-10-CM | POA: Diagnosis not present

## 2015-12-05 DIAGNOSIS — Z79899 Other long term (current) drug therapy: Secondary | ICD-10-CM | POA: Diagnosis not present

## 2015-12-05 DIAGNOSIS — R21 Rash and other nonspecific skin eruption: Secondary | ICD-10-CM | POA: Diagnosis present

## 2015-12-05 MED ORDER — CLOTRIMAZOLE 1 % EX CREA: 1.0000 "application " | TOPICAL_CREAM | Freq: Two times a day (BID) | CUTANEOUS | 0 refills | Status: DC

## 2015-12-05 NOTE — ED Notes (Signed)
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  Denies any change in soaps, detergents, lotions, etc.

## 2015-12-05 NOTE — ED Provider Notes (Signed)
[facility] Emergency Department Provider Note  ____________________________________________   First MD Initiated Contact with Patient 12/05/15 0139     (approximate)  I have reviewed the triage vital signs and the nursing notes.   HISTORY  Chief Complaint Rash   HPI Carolyn Bryant is a 35 y.o. female with a history of rash to the anterior chest wall who is presenting with repeat rash to the anterior chest wall. The patient says that she has had a similar problem several years ago and was given "an antibiotic cream" which resolved the rash. She says over the past 4 days she has had a scaling rash which she says hurts especially when she sweats. 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 She says that she has been using antibiotic cream over the past several days, Neosporin, without any relief. Denies any lesions on her hands and soles of her feet. Does not report any fever.   Past Medical History:  Diagnosis Date  . Asthma     There are no active problems to display for this patient.   Past Surgical History:  Procedure Laterality Date  . TUBAL LIGATION      Prior to Admission medications   Medication Sig Start Date End Date Taking? Authorizing Provider  ibuprofen (ADVIL,MOTRIN) 800 MG tablet Take 1 tablet (800 mg total) by mouth every 8 (eight) hours as needed. 09/19/15   Mortimer Fries, PA-C  penicillin v potassium (VEETID) 500 MG tablet Take 1 tablet (500 mg total) by mouth 4 (four) times daily. 03/02/15   Jenise V Bacon Menshew, PA-C  traMADol (ULTRAM) 50 MG tablet Take 1 tablet (50 mg total) by mouth every 6 (six) hours as needed. 09/19/15 09/18/16  Mortimer Fries, PA-C    Allergies Peanuts [peanut oil] and Prednisone  No family history on file.  Social History Social History  Substance Use Topics  . Smoking status: Current Every Day Smoker    Packs/day: 0.10    Types: Cigarettes  .  Smokeless tobacco: Never Used  . Alcohol use Yes     Comment: occasional    Review of Systems Constitutional: No fever/chills Eyes: No visual changes. ENT: No sore throat. Cardiovascular: Denies chest pain. Respiratory: Denies shortness of breath. Gastrointestinal: No abdominal pain.  No nausea, no vomiting.  No diarrhea.  No constipation. Genitourinary: Negative for dysuria. Musculoskeletal: Negative for back pain. Skin: As above Neurological: Negative for headaches, focal weakness or numbness.  10-point ROS otherwise negative.  ____________________________________________   PHYSICAL EXAM:  VITAL SIGNS: ED Triage Vitals [12/05/15 0039]  Enc Vitals Group     BP 131/72     Pulse Rate 70     Resp 18     Temp 98.2 F (36.8 C)     Temp Source Oral     SpO2 99 %     Weight 145 lb (65.8 kg)     Height 5\' 2"  (1.575 m)     Head Circumference      Peak Flow      Pain Score 6     Pain Loc      Pain Edu?      Excl. in [place]?     Constitutional: Alert and oriented. Well appearing and in no acute distress. Eyes: Conjunctivae are normal. PERRL. EOMI. Head: Atraumatic. Nose: No congestion/rhinnorhea. Mouth/Throat: Mucous membranes are moist.   Neck: No stridor.   Cardiovascular: Normal  rate, regular rhythm. Grossly normal heart sounds.   Respiratory: Normal respiratory effort.  No retractions. Lungs CTAB. Gastrointestinal: Soft and nontender. No distention.  Musculoskeletal: No lower extremity tenderness nor edema.  No joint effusions. Neurologic:  Normal speech and language. No gross focal neurologic deficits are appreciated. No gait instability. Skin:  Triangular shaped rash anteriorly between the breasts which is about 4 cm at its widest margin. The rash is scaling without any induration or pus. Mild tenderness to palpation. The rash does not extend under the breasts. Psychiatric: Mood and affect are normal. Speech and behavior are  normal.  ____________________________________________   LABS (all labs ordered are listed, but only abnormal results are displayed)  Labs Reviewed - No data to display ____________________________________________  EKG   ____________________________________________  RADIOLOGY   ____________________________________________   PROCEDURES  Procedure(s) performed:   Procedures  Critical Care performed:   ____________________________________________   INITIAL IMPRESSION / ASSESSMENT AND PLAN / ED COURSE  Pertinent labs & imaging results that were available during my care of the patient were reviewed by me and considered in my medical decision making (see chart for details).    Clinical Course   Appears as tinea. We'll give topical cream. I also discussed with the patient keeping the area dry in addition to the cream. She is understanding the plan and willing to comply. Will follow-up with the outpatient setting.   ____________________________________________   FINAL CLINICAL IMPRESSION(S) / ED DIAGNOSES  Tinea corporis.    NEW MEDICATIONS STARTED DURING THIS VISIT:  New Prescriptions   No medications on file     Note:  This document was prepared using Dragon voice recognition software and may include unintentional dictation errors.     Orbie Pyo, MD 12/05/15 [phone]

## 2015-12-05 NOTE — ED Triage Notes (Addendum)
Pt has itching rash between breast for 4 days.  Pt using neosporin on rash without relief    Pt alert.

## 2016-02-14 ENCOUNTER — Encounter: Payer: Self-pay | Admitting: Emergency Medicine

## 2016-02-14 ENCOUNTER — Emergency Department
Admission: EM | Admit: 2016-02-14 | Discharge: 2016-02-14 | Disposition: A | Discharge status: Home / Self Care | Payer: BLUE CROSS/BLUE SHIELD | Attending: Emergency Medicine | Admitting: Emergency Medicine

## 2016-02-14 DIAGNOSIS — N390 Urinary tract infection, site not specified: Secondary | ICD-10-CM | POA: Insufficient documentation

## 2016-02-14 DIAGNOSIS — M545 Low back pain, unspecified: Secondary | ICD-10-CM

## 2016-02-14 DIAGNOSIS — F17210 Nicotine dependence, cigarettes, uncomplicated: Secondary | ICD-10-CM | POA: Insufficient documentation

## 2016-02-14 DIAGNOSIS — J45909 Unspecified asthma, uncomplicated: Secondary | ICD-10-CM | POA: Insufficient documentation

## 2016-02-14 LAB — URINALYSIS, COMPLETE (UACMP) WITH MICROSCOPIC
Bilirubin Urine: NEGATIVE
Glucose, UA: NEGATIVE mg/dL
Hgb urine dipstick: NEGATIVE
Ketones, ur: 5 mg/dL — AB
Nitrite: NEGATIVE
Protein, ur: NEGATIVE mg/dL
Specific Gravity, Urine: 1.025 (ref 1.005–1.030)
pH: 7 (ref 5.0–8.0)

## 2016-02-14 LAB — POCT PREGNANCY, URINE: Preg Test, Ur: NEGATIVE

## 2016-02-14 MED ORDER — HYDROCODONE-ACETAMINOPHEN 5-325 MG PO TABS: 1.0000 | ORAL_TABLET | ORAL | 0 refills | Status: DC | PRN

## 2016-02-14 MED ORDER — SULFAMETHOXAZOLE-TRIMETHOPRIM 800-160 MG PO TABS: 1.0000 | ORAL_TABLET | Freq: Two times a day (BID) | ORAL | 0 refills | Status: DC

## 2016-02-14 NOTE — ED Provider Notes (Signed)
[facility] Emergency Department Provider Note   ____________________________________________   First MD Initiated Contact with Patient 02/14/16 1106     (approximate)  I have reviewed the triage vital signs and the nursing notes.   HISTORY  Chief Complaint Back Pain    HPI Carolyn Bryant is a 36 y.o. female is here complaining of low back pain mostly on the left radiating into her left leg. Patient states he feels achy and she has a jittery feeling. She denies any injury. She has not taken any over-the-counter medication for this. Patient has noted back pain for one week but only this morning did she started having left-sided back pain. She denies any urinary symptoms or history of kidney stones. She is unaware of any fever, chills, nausea or vomiting. She states that she was at work and was having difficulty with movement due to her back pain and had to leave work. She denies any paresthesias or and cons of bowel or bladder. She rates her pain as an 8 out of 10 at this time.   Past Medical History:  Diagnosis Date  . Asthma     There are no active problems to display for this patient.   Past Surgical History:  Procedure Laterality Date  . TUBAL LIGATION      Prior to Admission medications   Medication Sig Start Date End Date Taking? Authorizing Provider  clotrimazole (LOTRIMIN) 1 % cream Apply 1 application topically 2 (two) times daily. Use for at least 4 weeks (or for 1 week after rash has healed) 12/05/15   Orbie Pyo, MD  HYDROcodone-acetaminophen (NORCO/VICODIN) 5-325 MG tablet Take 1 tablet by mouth every 4 (four) hours as needed for moderate pain. 02/14/16   Johnn Hai, PA-C  sulfamethoxazole-trimethoprim (BACTRIM DS,SEPTRA DS) 800-160 MG tablet Take 1 tablet by mouth 2 (two) times daily. 02/14/16   Johnn Hai, PA-C    Allergies Peanuts [peanut oil] and Prednisone  No family history on file.  Social  History Social History  Substance Use Topics  . Smoking status: Current Every Day Smoker    Packs/day: 0.10    Types: Cigarettes  . Smokeless tobacco: Never Used  . Alcohol use Yes     Comment: occasional    Review of Systems Constitutional: No fever/chills Cardiovascular: Denies chest pain. Respiratory: Denies shortness of breath. Gastrointestinal: No abdominal pain.  No nausea, no vomiting.  No diarrhea.  No constipation. Genitourinary: Negative for dysuria.Negative for kidney stones. Musculoskeletal: Positive for low back pain. Skin: Negative for rash. Neurological: Negative for headaches, focal weakness or numbness.  10-point ROS otherwise negative.  ____________________________________________   PHYSICAL EXAM:  VITAL SIGNS: ED Triage Vitals  Enc Vitals Group     BP 02/14/16 1041 (!) 126/93     Pulse Rate 02/14/16 1041 77     Resp 02/14/16 1041 18     Temp 02/14/16 1041 98.2 F (36.8 C)     Temp Source 02/14/16 1041 Oral     SpO2 02/14/16 1041 99 %     Weight 02/14/16 1042 135 lb (61.2 kg)     Height 02/14/16 1042 5\' 2"  (1.575 m)     Head Circumference --      Peak Flow --      Pain Score 02/14/16 1042 8     Pain Loc --      Pain Edu? --      Excl. in [place]? --     Constitutional: Alert  and oriented. Well appearing and in no acute distress. Eyes: Conjunctivae are normal. PERRL. EOMI. Head: Atraumatic. Nose: No congestion/rhinnorhea. Neck: No stridor.   Cardiovascular: Normal rate, regular rhythm. Grossly normal heart sounds.  Good peripheral circulation. Respiratory: Normal respiratory effort.  No retractions. Lungs CTAB. Gastrointestinal: Soft and nontender. No distention. Musculoskeletal: Moves upper and lower extremities without any difficulty. Normal gait was noted. On examination of the back there is no gross deformity noted. There is no tenderness on palpation of the lumbar spine. To the left is some tenderness of the paravertebral muscles. Muscles have  good strength. Motor sensory function intact. Neurologic:  Normal speech and language. No gross focal neurologic deficits are appreciated. No gait instability. Skin:  Skin is warm, dry and intact. No rash noted. Psychiatric: Mood and affect are normal. Speech and behavior are normal.  ____________________________________________   LABS (all labs ordered are listed, but only abnormal results are displayed)  Labs Reviewed  URINALYSIS, COMPLETE (UACMP) WITH MICROSCOPIC - Abnormal; Notable for the following:       Result Value   Color, Urine YELLOW (*)    APPearance CLEAR (*)    Ketones, ur 5 (*)    Leukocytes, UA TRACE (*)    Bacteria, UA RARE (*)    Squamous Epithelial / LPF 0-5 (*)    All other components within normal limits  POC URINE PREG, ED  POCT PREGNANCY, URINE     PROCEDURES  Procedure(s) performed: None  Procedures  Critical Care performed: No  ____________________________________________   INITIAL IMPRESSION / ASSESSMENT AND PLAN / ED COURSE  Pertinent labs & imaging results that were available during my care of the patient were reviewed by me and considered in my medical decision making (see chart for details).  Urinalysis showed  WBCs 6-30, with trace leukocytes. Patient was treated for urinary tract infection. She was started on Bactrim DS twice a day for 10 days and Norco as needed for back pain. She is instructed to increase fluids and also follow-up with her doctor at [facility] if any continued problems.    ____________________________________________   FINAL CLINICAL IMPRESSION(S) / ED DIAGNOSES  Final diagnoses:  Acute urinary tract infection  Acute left-sided low back pain without sciatica      NEW MEDICATIONS STARTED DURING THIS VISIT:  Discharge Medication List as of 02/14/2016 12:09 PM    START taking these medications   Details  HYDROcodone-acetaminophen (NORCO/VICODIN) 5-325 MG tablet Take 1 tablet by mouth every 4 (four) hours as  needed for moderate pain., Starting Fri 02/14/2016, Print    sulfamethoxazole-trimethoprim (BACTRIM DS,SEPTRA DS) 800-160 MG tablet Take 1 tablet by mouth 2 (two) times daily., Starting Fri 02/14/2016, Print         Note:  This document was prepared using Dragon voice recognition software and may include unintentional dictation errors.    Johnn Hai, PA-C 02/14/16 1550    Harvest Dark, MD 02/15/16 [phone]

## 2016-02-14 NOTE — Discharge Instructions (Signed)
Follow-up with [facility]. Increase fluids. Take antibiotic as directed twice a day for 10 days until finished. Norco as needed for back pain.

## 2016-02-14 NOTE — ED Notes (Signed)
Back pain x 1 week, worse on left side radiating down left leg. Denies any urinary sxs.

## 2016-02-14 NOTE — ED Triage Notes (Signed)
Left lower back pain radiating to left leg with achy, jittery feeling , no injury

## 2016-06-06 ENCOUNTER — Emergency Department
Admission: EM | Admit: 2016-06-06 | Discharge: 2016-06-06 | Disposition: A | Discharge status: Home / Self Care | Payer: BLUE CROSS/BLUE SHIELD | Attending: Emergency Medicine | Admitting: Emergency Medicine

## 2016-06-06 ENCOUNTER — Encounter: Payer: Self-pay | Admitting: Medical Oncology

## 2016-06-06 DIAGNOSIS — J45909 Unspecified asthma, uncomplicated: Secondary | ICD-10-CM | POA: Insufficient documentation

## 2016-06-06 DIAGNOSIS — K047 Periapical abscess without sinus: Secondary | ICD-10-CM

## 2016-06-06 DIAGNOSIS — F17210 Nicotine dependence, cigarettes, uncomplicated: Secondary | ICD-10-CM | POA: Insufficient documentation

## 2016-06-06 MED ORDER — CLINDAMYCIN HCL 300 MG PO CAPS
300.0000 mg | ORAL_CAPSULE | Freq: Three times a day (TID) | ORAL | 0 refills | Status: AC
Start: 2016-06-06 — End: 2016-06-16

## 2016-06-06 MED ORDER — HYDROCODONE-ACETAMINOPHEN 5-325 MG PO TABS: 1.0000 | ORAL_TABLET | ORAL | 0 refills | Status: DC | PRN

## 2016-06-06 MED ORDER — NAPROXEN 500 MG PO TABS
500.0000 mg | ORAL_TABLET | Freq: Two times a day (BID) | ORAL | 0 refills | Status: DC
Start: 2016-06-06 — End: 2017-01-25

## 2016-06-06 NOTE — ED Provider Notes (Signed)
Took call from [facility] -- clindamycin too expensive for patient.  I reviewed chart from NP for dental source. Changed to pen vk 500mg  qid x 1 week.   Lisa Roca, MD 06/06/16 [phone]

## 2016-06-06 NOTE — ED Provider Notes (Signed)
[facility] Emergency Department Provider Note ____________________________________________  Time seen: Approximately 9:02 AM  I have reviewed the triage vital signs and the nursing notes.   HISTORY  Chief Complaint Dental Pain   HPI Carolyn Bryant is a 36 y.o. female who presents to the emergency department for evaluation of left-sided dental pain that began about 3 days ago. Upon awakening this morning, she noticed some swelling to the left lower jaw.  Past Medical History:  Diagnosis Date  . Asthma     There are no active problems to display for this patient.   Past Surgical History:  Procedure Laterality Date  . TUBAL LIGATION      Prior to Admission medications   Medication Sig Start Date End Date Taking? Authorizing Provider  clindamycin (CLEOCIN) 300 MG capsule Take 1 capsule (300 mg total) by mouth 3 (three) times daily. 06/06/16 06/16/16  Clara Smolen, Johnette Abraham B, FNP  clotrimazole (LOTRIMIN) 1 % cream Apply 1 application topically 2 (two) times daily. Use for at least 4 weeks (or for 1 week after rash has healed) 12/05/15   Schaevitz, Randall An, MD  HYDROcodone-acetaminophen (NORCO/VICODIN) 5-325 MG tablet Take 1 tablet by mouth every 4 (four) hours as needed for moderate pain. 06/06/16   Neomia Herbel, Johnette Abraham B, FNP  naproxen (NAPROSYN) 500 MG tablet Take 1 tablet (500 mg total) by mouth 2 (two) times daily with a meal. 06/06/16   Rober Skeels B, FNP    Allergies Peanuts [peanut oil] and Prednisone  No family history on file.  Social History Social History  Substance Use Topics  . Smoking status: Current Every Day Smoker    Packs/day: 0.10    Types: Cigarettes  . Smokeless tobacco: Never Used  . Alcohol use Yes     Comment: occasional    Review of Systems Constitutional: Well appearing. ENT: Positive for dental pain. Musculoskeletal: Negative for trismus  Skin: Positive for  swelling ____________________________________________   PHYSICAL EXAM:  VITAL SIGNS: ED Triage Vitals  Enc Vitals Group     BP 06/06/16 0853 (!) 141/85     Pulse Rate 06/06/16 0853 78     Resp 06/06/16 0853 16     Temp 06/06/16 0853 98.9 F (37.2 C)     Temp Source 06/06/16 0853 Oral     SpO2 06/06/16 0853 100 %     Weight 06/06/16 0851 135 lb (61.2 kg)     Height --      Head Circumference --      Peak Flow --      Pain Score 06/06/16 0857 9     Pain Loc --      Pain Edu? --      Excl. in [place]? --     Constitutional: Alert and oriented. Well appearing and in no acute distress. Eyes: Conjunctivae are clear without drainage Mouth/Throat: Mucous membranes are moist. Airway is patent Periodontal Exam    Hematological/Lymphatic/Immunilogical: No palpable lymphadenopathy noted Respiratory: Even and unlabored Musculoskeletal: Full range of motion throughout Neurologic: Alert and oriented.  Skin:  Edema without erythema over the left lower mandible Psychiatric: Normal behavior. Normal affect.  ____________________________________________   LABS (all labs ordered are listed, but only abnormal results are displayed)  Labs Reviewed - No data to display ____________________________________________   RADIOLOGY  Not indicated ____________________________________________   PROCEDURES  Procedure(s) performed: None  Critical Care performed: No ____________________________________________   INITIAL IMPRESSION / ASSESSMENT AND PLAN / ED COURSE  Carolyn Bryant is a  36 y.o. female who presents to the emergency department for treatment of dental abscess. She'll be started on clindamycin and given a short course of Norco due to swelling and obvious pain. She was given a long list of dental clinics and advised to call first thing Monday morning to schedule an appointment. She was instructed to return to the emergency department for symptoms that change or worsen if unable to  schedule an appointment.  Pertinent labs & imaging results that were available during my care of the patient were reviewed by me and considered in my medical decision making (see chart for details).  ____________________________________________   FINAL CLINICAL IMPRESSION(S) / ED DIAGNOSES  Final diagnoses:  Dental abscess    New Prescriptions   CLINDAMYCIN (CLEOCIN) 300 MG CAPSULE    Take 1 capsule (300 mg total) by mouth 3 (three) times daily.   HYDROCODONE-ACETAMINOPHEN (NORCO/VICODIN) 5-325 MG TABLET    Take 1 tablet by mouth every 4 (four) hours as needed for moderate pain.   NAPROXEN (NAPROSYN) 500 MG TABLET    Take 1 tablet (500 mg total) by mouth 2 (two) times daily with a meal.    If controlled substance prescribed during this visit, 12 month history viewed on the [place] prior to issuing an initial prescription for Schedule II or III opiod.  Note:  This document was prepared using Dragon voice recognition software and may include unintentional dictation errors.    Victorino Dike, FNP 06/06/16 7893    Nena Polio, MD 06/06/16 [phone]

## 2016-06-06 NOTE — Discharge Instructions (Signed)
OPTIONS FOR DENTAL FOLLOW UP CARE ° °[place] Department [facility] and Human Services - Local Safety Net Dental Clinics °http://www.ncdhhs.gov/dph/oralhealth/services/safetynetclinics.htm °  °[facility] (336-562-3123) ° °[facility] (919-933-9087) ° °[facility] ([phone] ext [phone]) ° °[facility] ([phone]) ° °[facility] ([phone]) °This clinic caters to the indigent population and is on a lottery system. °Location: °[facility], [address], [place] °Clinic Hours: °Wednesdays from 6pm - 9pm, patients seen by a lottery system. °For dates, call or go to [email or web address].[facility].edu/[facility]/patients/Dental-[facility] °Services: °Cleanings, fillings and simple extractions. °Payment Options: °DENTAL WORK IS FREE OF CHARGE. Bring proof of income or support. °Best way to get seen: °Arrive at 5:15 pm - this is a lottery, NOT first come/first serve, so arriving earlier will not increase your chances of being seen. °  °  °[facility] °[phone] °Select option 1 for emergencies °  °Location: °[facility], [address], [place] °Clinic Hours: °No walk-ins accepted - call the day before to schedule an appointment. °Check in times are 9:30 am and 1:30 pm. °Services: °Simple extractions, temporary fillings, pulpectomy/pulp debridement, uncomplicated abscess drainage. °Payment Options: °PAYMENT IS DUE AT THE TIME OF SERVICE.  Fee is usually $100-200, additional surgical procedures (e.g. abscess drainage) may be extra. °Cash, checks, Visa/MasterCard accepted.  Can file Medicaid if patient is covered for dental - patient should call case worker to check. °No discount for [facility] patients. °Best way to get seen: °MUST call the day before and get onto the schedule. Can usually be seen the next 1-2 days. No walk-ins accepted. °  °  °[facility] °[phone] °   °Location: °[facility], [address], [place] °Clinic Hours: °M, W, Th, F 8am or 1:30pm, Tues 9a or 1:30 - first come/first served. °Services: °Simple extractions, temporary fillings, uncomplicated abscess drainage.  You do not need to be an [place] resident. °Payment Options: °PAYMENT IS DUE AT THE TIME OF SERVICE. °Dental insurance, otherwise sliding scale - bring proof of income or support. °Depending on income and treatment needed, cost is usually $50-200. °Best way to get seen: °Arrive early as it is first come/first served. °  °  °[facility] °[phone] °  °Location: °[address] °Clinic Hours: °Mon-Thu 8a-5p °Services: °Most basic dental services including extractions and fillings. °Payment Options: °PAYMENT IS DUE AT THE TIME OF SERVICE. °Sliding scale, up to 50% off - bring proof if income or support. °Medicaid with dental option accepted. °Best way to get seen: °Call to schedule an appointment, can usually be seen within 2 weeks OR they will try to see walk-ins - show up at 8a or 2p (you may have to wait). °  °  °[facility] °919-245-2435 °[place] RESIDENTS ONLY °  °Location: °[facility], [address], [place], [place] [address] °Clinic Hours: By appointment only. °Monday - Thursday 8am-5pm, Friday 8am-12pm °Services: Cleanings, fillings, extractions. °Payment Options: °PAYMENT IS DUE AT THE TIME OF SERVICE. °Cash, Visa or MasterCard. Sliding scale - $30 minimum per service. °Best way to get seen: °Come in to office, complete packet and make an appointment - need proof of income °or support monies for each household member and proof of [place] residence. °Usually takes about a month to get in. °  °  °[facility] °[phone] °  °Location: °[address]   [place] °Clinic Hours: Walk-in Urgent Care Dental Services are offered Monday-Friday  mornings only. °The numbers of emergencies accepted daily is limited to the number of °providers available. °Maximum 15 - Mondays, Wednesdays & Thursdays °Maximum 10 - Tuesdays & Fridays °Services: °You do not need to be a [place] resident to be seen for a dental emergency. °Emergencies are defined as pain, swelling, abnormal bleeding, or dental trauma. Walkins will receive x-rays if needed. °NOTE: Dental cleaning is not an emergency. °Payment Options: °PAYMENT IS DUE AT THE TIME OF SERVICE. °Minimum co-pay is $40.00 for uninsured patients. °Minimum co-pay is $3.00 for Medicaid with dental coverage. °Dental Insurance is accepted and must be presented at time of visit. °Medicare does not cover dental. °Forms of payment: Cash, credit card, checks. °Best way to get seen: °If not previously registered with the clinic, walk-in dental registration begins at 7:15 am and is on a first come/first serve basis. °If previously registered with the clinic, call to make an appointment. °  °  °[facility] °[phone] °[place] RESIDENTS ONLY °  °Location: °[address], [place], [place] °Clinic Hours: °Mon-Thu 10a-2p °Services: Extractions only! °Payment Options: °FREE (donations accepted) - bring proof of income or support °Best way to get seen: °Call and schedule an appointment OR come at 8am on the 1st Monday of every month (except for holidays) when it is first come/first served. °  °  °[facility] °[phone] °  °Location: °[address], [place] °Clinic Hours: °Friday mornings °Services, Payment Options, Best way to get seen: °Call for info °

## 2016-06-06 NOTE — ED Triage Notes (Signed)
Pt reports left sided dental pain that began 3 days ago, swelling noted that began this am.

## 2016-08-01 ENCOUNTER — Emergency Department
Admission: EM | Admit: 2016-08-01 | Discharge: 2016-08-01 | Disposition: A | Discharge status: Home / Self Care | Payer: Self-pay | Attending: Emergency Medicine | Admitting: Emergency Medicine

## 2016-08-01 ENCOUNTER — Emergency Department: Payer: Self-pay

## 2016-08-01 ENCOUNTER — Encounter: Payer: Self-pay | Admitting: Emergency Medicine

## 2016-08-01 DIAGNOSIS — K029 Dental caries, unspecified: Secondary | ICD-10-CM | POA: Insufficient documentation

## 2016-08-01 DIAGNOSIS — K047 Periapical abscess without sinus: Secondary | ICD-10-CM | POA: Insufficient documentation

## 2016-08-01 DIAGNOSIS — K0889 Other specified disorders of teeth and supporting structures: Secondary | ICD-10-CM | POA: Insufficient documentation

## 2016-08-01 DIAGNOSIS — F17210 Nicotine dependence, cigarettes, uncomplicated: Secondary | ICD-10-CM | POA: Insufficient documentation

## 2016-08-01 DIAGNOSIS — Z91010 Allergy to peanuts: Secondary | ICD-10-CM | POA: Insufficient documentation

## 2016-08-01 LAB — CBC WITH DIFFERENTIAL/PLATELET
Basophils Absolute: 0 10*3/uL (ref 0–0.1)
Basophils Relative: 1 %
Eosinophils Absolute: 0.1 10*3/uL (ref 0–0.7)
Eosinophils Relative: 1 %
HEMATOCRIT: 34.7 % — AB (ref 35.0–47.0)
HEMOGLOBIN: 11.8 g/dL — AB (ref 12.0–16.0)
LYMPHS ABS: 1.6 10*3/uL (ref 1.0–3.6)
LYMPHS PCT: 25 %
MCH: 31.5 pg (ref 26.0–34.0)
MCHC: 34.1 g/dL (ref 32.0–36.0)
MCV: 92.4 fL (ref 80.0–100.0)
MONOS PCT: 7 %
Monocytes Absolute: 0.5 10*3/uL (ref 0.2–0.9)
NEUTROS PCT: 66 %
Neutro Abs: 4.2 10*3/uL (ref 1.4–6.5)
Platelets: 362 10*3/uL (ref 150–440)
RBC: 3.75 MIL/uL — AB (ref 3.80–5.20)
RDW: 16.1 % — ABNORMAL HIGH (ref 11.5–14.5)
WBC: 6.4 10*3/uL (ref 3.6–11.0)

## 2016-08-01 LAB — COMPREHENSIVE METABOLIC PANEL
ALT: 36 U/L (ref 14–54)
AST: 46 U/L — AB (ref 15–41)
Albumin: 3.8 g/dL (ref 3.5–5.0)
Alkaline Phosphatase: 67 U/L (ref 38–126)
Anion gap: 8 (ref 5–15)
BUN: 9 mg/dL (ref 6–20)
CHLORIDE: 107 mmol/L (ref 101–111)
CO2: 23 mmol/L (ref 22–32)
CREATININE: 0.82 mg/dL (ref 0.44–1.00)
Calcium: 9.2 mg/dL (ref 8.9–10.3)
GFR calc non Af Amer: >60 mL/min (ref >60)
Glucose, Bld: 110 mg/dL — ABNORMAL HIGH (ref 65–99)
POTASSIUM: 3.9 mmol/L (ref 3.5–5.1)
SODIUM: 138 mmol/L (ref 135–145)
Total Bilirubin: 0.4 mg/dL (ref 0.3–1.2)
Total Protein: 7.9 g/dL (ref 6.5–8.1)

## 2016-08-01 LAB — POCT PREGNANCY, URINE: PREG TEST UR: NEGATIVE

## 2016-08-01 MED ORDER — SULFAMETHOXAZOLE-TRIMETHOPRIM 800-160 MG PO TABS: 1.0000 | ORAL_TABLET | Freq: Two times a day (BID) | ORAL | 0 refills | Status: DC

## 2016-08-01 MED ORDER — LIDOCAINE-EPINEPHRINE 2 %-1:100000 IJ SOLN
1.7000 mL | Freq: Once | INTRAMUSCULAR | Status: AC
Start: 2016-08-01 — End: 2016-08-01
  Administered 2016-08-01: 1.7 mL via INTRADERMAL
  Filled 2016-08-01: qty 1.7

## 2016-08-01 MED ORDER — IOPAMIDOL (ISOVUE-300) INJECTION 61%
75.0000 mL | Freq: Once | INTRAVENOUS | Status: AC | PRN
  Administered 2016-08-01: 75 mL via INTRAVENOUS
  Filled 2016-08-01: qty 75

## 2016-08-01 MED ORDER — TRAMADOL HCL 50 MG PO TABS: 50.0000 mg | ORAL_TABLET | Freq: Four times a day (QID) | ORAL | 0 refills | Status: DC | PRN

## 2016-08-01 MED ORDER — LIDOCAINE-EPINEPHRINE 2 %-1:100000 IJ SOLN
INTRAMUSCULAR | Status: AC
  Administered 2016-08-01: 1.7 mL via INTRADERMAL
  Filled 2016-08-01: qty 1.7

## 2016-08-01 MED ORDER — OXYCODONE-ACETAMINOPHEN 5-325 MG PO TABS
1.0000 | ORAL_TABLET | Freq: Once | ORAL | Status: AC
  Administered 2016-08-01: 1 via ORAL
  Filled 2016-08-01: qty 1

## 2016-08-01 NOTE — ED Notes (Signed)
Lidocaine obtained for pa

## 2016-08-01 NOTE — ED Triage Notes (Signed)
Lower L dental pain x 5 days.

## 2016-08-01 NOTE — ED Notes (Signed)
See triage note  States she developed some pain with min swelling to left side of face couple of days woke up this am with increased swelling to left side of face and pain is radiating into ear

## 2016-08-01 NOTE — ED Provider Notes (Signed)
[facility] Emergency Department Provider Note   ____________________________________________   I have reviewed the triage vital signs and the nursing notes.   HISTORY  Chief Complaint Dental Pain    HPI Carolyn Bryant is a 36 y.o. female presents to the emergency department with left-sided facial swelling and pain radiating into the ear with associated left lower dental pain. Patient presented with significant dental caries, broken teeth and states she does not see a dentist regular. Patient also reports having several dental abscesses over the last 3 years with some requiring I&D and others being treated antibiotics alone. Patient also reports headache and mild left eye pain. She denies any recent dental trauma. Patient denies fever, chills, headache, vision changes, chest pain, chest tightness, shortness of breath, abdominal pain, nausea and vomiting.  Past Medical History:  Diagnosis Date  . Asthma     There are no active problems to display for this patient.   Past Surgical History:  Procedure Laterality Date  . TUBAL LIGATION      Prior to Admission medications   Medication Sig Start Date End Date Taking? Authorizing Provider  clotrimazole (LOTRIMIN) 1 % cream Apply 1 application topically 2 (two) times daily. Use for at least 4 weeks (or for 1 week after rash has healed) 12/05/15   Schaevitz, Randall An, MD  HYDROcodone-acetaminophen (NORCO/VICODIN) 5-325 MG tablet Take 1 tablet by mouth every 4 (four) hours as needed for moderate pain. 06/06/16   Triplett, Johnette Abraham B, FNP  naproxen (NAPROSYN) 500 MG tablet Take 1 tablet (500 mg total) by mouth 2 (two) times daily with a meal. 06/06/16   Triplett, Cari B, FNP  sulfamethoxazole-trimethoprim (BACTRIM DS,SEPTRA DS) 800-160 MG tablet Take 1 tablet by mouth 2 (two) times daily. 08/01/16   Jennyfer Nickolson M, PA-C  traMADol (ULTRAM) 50 MG tablet Take 1 tablet (50 mg total) by mouth every 6 (six) hours as  needed. 08/01/16 08/01/17  Fumie Fiallo M, PA-C    Allergies Peanuts [peanut oil] and Prednisone  No family history on file.  Social History Social History  Substance Use Topics  . Smoking status: Current Every Day Smoker    Packs/day: 0.10    Types: Cigarettes  . Smokeless tobacco: Never Used  . Alcohol use Yes     Comment: occasional    Review of Systems Constitutional: Negative for fever/chills Eyes: No visual changes. ENT:  Negative for sore throat and for difficulty swallowing. Left lower dental pain with swelling and erythema along the left side of the face extending from the left eye to the mandible.  Cardiovascular: Denies chest pain. Respiratory: Denies cough. Denies shortness of breath. Gastrointestinal: No abdominal pain.  No nausea, vomiting, diarrhea. Skin: Negative for rash. Neurological: Negative for headaches.  Negative focal weakness or numbness. Negative for loss of consciousness. Able to ambulate. ____________________________________________   PHYSICAL EXAM:  VITAL SIGNS: ED Triage Vitals  Enc Vitals Group     BP 08/01/16 1201 126/78     Pulse Rate 08/01/16 1201 86     Resp 08/01/16 1201 18     Temp 08/01/16 1201 98.4 F (36.9 C)     Temp Source 08/01/16 1201 Oral     SpO2 08/01/16 1201 100 %     Weight 08/01/16 1202 140 lb (63.5 kg)     Height 08/01/16 1202 5\' 2"  (1.575 m)     Head Circumference --      Peak Flow --      Pain Score  08/01/16 1201 9     Pain Loc --      Pain Edu? --      Excl. in [place]? --     Constitutional: Alert and oriented. Well appearing and in mild distress.  Head: Normocephalic and atraumatic. Eyes: Conjunctivae are normal. PERRL. Normal extraocular movements.  Nose: No congestion/rhinorrhea Mouth/Throat: Mucous membranes are moist. Oropharynx clear with erythema along the left lower gumline, dental caries throughout all teeth. Swelling with induration along the left cheek extending from the mandibular region to the left  lower eyelid and just below the left ear. Erythema noted as well. Neck: Supple.  Hematological/Lymphatic/Immunological: submandibular lymphadenopathy. Cardiovascular: Normal rate, regular rhythm. Normal distal pulses. Respiratory: Normal respiratory effort. No wheezes/rales/rhonchi. Lungs CTAB Musculoskeletal: Nontender with normal range of motion in all extremities. Neurologic: Normal speech and language.  Skin:  Skin is warm, dry and intact. No rash noted.  ____________________________________________   LABS (all labs ordered are listed, but only abnormal results are displayed)  Labs Reviewed  COMPREHENSIVE METABOLIC PANEL - Abnormal; Notable for the following:       Result Value   Glucose, Bld 110 (*)    AST 46 (*)    All other components within normal limits  CBC WITH DIFFERENTIAL/PLATELET - Abnormal; Notable for the following:    RBC 3.75 (*)    Hemoglobin 11.8 (*)    HCT 34.7 (*)    RDW 16.1 (*)    All other components within normal limits  POC URINE PREG, ED  POCT PREGNANCY, URINE   ____________________________________________  EKG none ____________________________________________  RADIOLOGY CT NECK WITH CONTRAST, soft tissue IMPRESSION: 1. Multiple dental caries and extensive periodontal disease as described. 2. Lateral cortical dehiscence adjacent to the left second mandibular molar with associated subperiosteal abscess measuring 10 x 9 x 6 mm. 3. Extensive soft tissue swelling surrounding the abscess. 4. Enlarged left submandibular, level 2, and superficial lymph nodes are likely reactive. 5. Multiple dental caries and periodontal disease within the maxillary teeth as described without associated abscess. ____________________________________________   PROCEDURES  Procedure(s) performed:  DENTAL BLOCK  Performed by: Jerolyn Shin Consent: Verbal consent obtained. Required items: devices and special equipment available Time out: Immediately prior to  procedure a "time out" was called to verify the correct patient, procedure, equipment, support staff and site/side marked as required.  Indication: dental pain Nerve block body site: Inferior alveolar nerve block, left side   Preparation: Patient was prepped and draped in the usual sterile fashion. Needle gauge: 21 G Location technique:  Mucous membrane on the medial border of the mandibular ramus, lateral to the pterygomandibular raphe-anatomical landmarks  Local anesthetic: Xylocaine with epi (2% -1-10,000)  Anesthetic total: 1.7 ml  Outcome: pain improved Patient tolerance: Patient tolerated the procedure well with no immediate complications.     Critical Care performed: no ____________________________________________   INITIAL IMPRESSION / ASSESSMENT AND PLAN / ED COURSE  Pertinent labs & imaging results that were available during my care of the patient were reviewed by me and considered in my medical decision making (see chart for details).  Patient presents to emergency department left lower jaw dental pain, facial swelling and erythema. History, physical exam findings, imaging and labs are reassuring symptoms are consistent with dental abscess with extensive soft tissue swelling and reactive lymphadenopathy.. Patient responded well dental block assist with pain management during the course of care in the emergency department. See procedure note above. Patient will be prescribed Bactrim for antibiotic coverage and tramadol  for pain management . Patient advised to follow up with a dental clinic focontinued care or return to the emergency department if symptoms return or worsen. Patient informed of clinical course, understand medical decision-making process, and agree with plan.      ____________________________________________   FINAL CLINICAL IMPRESSION(S) / ED DIAGNOSES  Final diagnoses:  Pain, dental  Dental caries  Dental abscess       NEW MEDICATIONS STARTED  DURING THIS VISIT:  Discharge Medication List as of 08/01/2016  3:51 PM       Note:  This document was prepared using Dragon voice recognition software and may include unintentional dictation errors.    Makoto Sellitto, Laroy Apple, PA-C 08/01/16 1832    Jerolyn Shin, PA-C 08/01/16 Felicita Gage    Delman Kitten, MD 08/02/16 1310

## 2016-08-01 NOTE — Discharge Instructions (Signed)
OPTIONS FOR DENTAL FOLLOW UP CARE ° °[place] Department of Health and Human Services - Local Safety Net Dental Clinics °http://www.ncdhhs.gov/dph/oralhealth/services/safetynetclinics.htm °  °[facility] ([phone]) ° °[facility] [place] ([phone]) ° °[facility] ([phone]) ° °[facility] ([phone]) ° °[facility] ([phone]) °This clinic caters to the indigent population and is on a lottery system. °Location: °[facility], [address], [place] °Clinic Hours: °Wednesdays from 6pm - 9pm, patients seen by a lottery system. °For dates, call or go to [email or web address].edu/[facility]/patients/[email or web address]-[facility] °Services: °Cleanings, fillings and simple extractions. °Payment Options: °DENTAL WORK IS FREE OF CHARGE. Bring proof of income or support. °Best way to get seen: °Arrive at 5:15 pm - this is a lottery, NOT first come/first serve, so arriving earlier will not increase your chances of being seen. °  °  °[facility] °[phone] °Select option 1 for emergencies °  °Location: °[facility], [address], [place] °Clinic Hours: °No walk-ins accepted - call the day before to schedule an appointment. °Check in times are 9:30 am and 1:30 pm. °Services: °Simple extractions, temporary fillings, pulpectomy/pulp debridement, uncomplicated abscess drainage. °Payment Options: °PAYMENT IS DUE AT THE TIME OF SERVICE.  Fee is usually $100-200, additional surgical procedures (e.g. abscess drainage) may be extra. °Cash, checks, Visa/MasterCard accepted.  Can file Medicaid if patient is covered for dental - patient should call case worker to check. °No discount for [facility] patients. °Best way to get seen: °MUST call the day before and get onto the schedule. Can usually be seen the next 1-2 days. No walk-ins accepted. °  °  °[facility] °[phone] °   °Location: °[facility], [address], [place] °Clinic Hours: °M, W, Th, F 8am or 1:30pm, Tues 9a or 1:30 - first come/first served. °Services: °Simple extractions, temporary fillings, uncomplicated abscess drainage.  You do not need to be an [place] resident. °Payment Options: °PAYMENT IS DUE AT THE TIME OF SERVICE. °Dental insurance, otherwise sliding scale - bring proof of income or support. °Depending on income and treatment needed, cost is usually $50-200. °Best way to get seen: °Arrive early as it is first come/first served. °  °  °[facility] °[phone] °  °Location: °[address] °Clinic Hours: °Mon-Thu 8a-5p °Services: °Most basic dental services including extractions and fillings. °Payment Options: °PAYMENT IS DUE AT THE TIME OF SERVICE. °Sliding scale, up to 50% off - bring proof if income or support. °Medicaid with dental option accepted. °Best way to get seen: °Call to schedule an appointment, can usually be seen within 2 weeks OR they will try to see walk-ins - show up at 8a or 2p (you may have to wait). °  °  °[facility] °[phone] °[place] RESIDENTS ONLY °  °Location: °[facility], [address], [place], [place] [address] °Clinic Hours: By appointment only. °Monday - Thursday 8am-5pm, Friday 8am-12pm °Services: Cleanings, fillings, extractions. °Payment Options: °PAYMENT IS DUE AT THE TIME OF SERVICE. °Cash, Visa or MasterCard. Sliding scale - $30 minimum per service. °Best way to get seen: °Come in to office, complete packet and make an appointment - need proof of income °or support monies for each household member and proof of [place] residence. °Usually takes about a month to get in. °  °  °[facility] °[phone] °  °Location: °[address]   [place] °Clinic Hours: Walk-in Urgent Care Dental Services are offered Monday-Friday  mornings only. °The numbers of emergencies accepted daily is limited to the number of °providers available. °Maximum 15 - Mondays, Wednesdays & Thursdays °Maximum 10 - Tuesdays & Fridays °Services: °You do not need to be a [place] resident to be seen for a dental emergency. °Emergencies are defined as pain, swelling, abnormal bleeding, or dental trauma. Walkins will receive x-rays if needed. °NOTE: Dental cleaning is not an emergency. °Payment Options: °PAYMENT IS DUE AT THE TIME OF SERVICE. °Minimum co-pay is $40.00 for uninsured patients. °Minimum co-pay is $3.00 for Medicaid with dental coverage. °Dental Insurance is accepted and must be presented at time of visit. °Medicare does not cover dental. °Forms of payment: Cash, credit card, checks. °Best way to get seen: °If not previously registered with the clinic, walk-in dental registration begins at 7:15 am and is on a first come/first serve basis. °If previously registered with the clinic, call to make an appointment. °  °  °[facility] °[phone] °[place] RESIDENTS ONLY °  °Location: °[address], [place], [place] °Clinic Hours: °Mon-Thu 10a-2p °Services: Extractions only! °Payment Options: °FREE (donations accepted) - bring proof of income or support °Best way to get seen: °Call and schedule an appointment OR come at 8am on the 1st Monday of every month (except for holidays) when it is first come/first served. °  °  °[facility] °[phone] °  °Location: °[address], [place] °Clinic Hours: °Friday mornings °Services, Payment Options, Best way to get seen: °Call for info °

## 2017-01-25 ENCOUNTER — Encounter: Payer: Self-pay | Admitting: Emergency Medicine

## 2017-01-25 ENCOUNTER — Emergency Department
Admission: EM | Admit: 2017-01-25 | Discharge: 2017-01-25 | Disposition: A | Discharge status: Home / Self Care | Payer: BLUE CROSS/BLUE SHIELD | Attending: Emergency Medicine | Admitting: Emergency Medicine

## 2017-01-25 DIAGNOSIS — K029 Dental caries, unspecified: Secondary | ICD-10-CM | POA: Insufficient documentation

## 2017-01-25 DIAGNOSIS — F17210 Nicotine dependence, cigarettes, uncomplicated: Secondary | ICD-10-CM | POA: Insufficient documentation

## 2017-01-25 DIAGNOSIS — K047 Periapical abscess without sinus: Secondary | ICD-10-CM | POA: Insufficient documentation

## 2017-01-25 DIAGNOSIS — J45909 Unspecified asthma, uncomplicated: Secondary | ICD-10-CM | POA: Insufficient documentation

## 2017-01-25 MED ORDER — CEFTRIAXONE SODIUM 1 G IJ SOLR
1.0000 g | Freq: Once | INTRAMUSCULAR | Status: AC
  Administered 2017-01-25: 1 g via INTRAMUSCULAR
  Filled 2017-01-25: qty 10

## 2017-01-25 MED ORDER — PENICILLIN V POTASSIUM 500 MG PO TABS: 500.0000 mg | ORAL_TABLET | Freq: Four times a day (QID) | ORAL | 0 refills | Status: DC

## 2017-01-25 MED ORDER — TRAMADOL HCL 50 MG PO TABS: 50.0000 mg | ORAL_TABLET | Freq: Four times a day (QID) | ORAL | 0 refills | Status: DC | PRN

## 2017-01-25 NOTE — ED Provider Notes (Signed)
[facility] Emergency Department Provider Note   ____________________________________________   First MD Initiated Contact with Patient 01/25/17 [phone]     (approximate)  I have reviewed the triage vital signs and the nursing notes.   HISTORY  Chief Complaint Dental Pain and Facial Swelling   HPI Carolyn Bryant is a 37 y.o. female is here with complaint  of left lower dental pain that started about a week ago. Patient states that it is increased in pain in the last several days and this morning she woke up with facial swelling. She denies any fever, chills, nausea or vomiting. She does not have a dentist. She rates her pain as 9/10.   Past Medical History:  Diagnosis Date  . Asthma     There are no active problems to display for this patient.   Past Surgical History:  Procedure Laterality Date  . TUBAL LIGATION      Prior to Admission medications   Medication Sig Start Date End Date Taking? Authorizing Provider  penicillin v potassium (VEETID) 500 MG tablet Take 1 tablet (500 mg total) by mouth 4 (four) times daily. 01/25/17   Johnn Hai, PA-C  traMADol (ULTRAM) 50 MG tablet Take 1 tablet (50 mg total) by mouth every 6 (six) hours as needed. 01/25/17   Johnn Hai, PA-C    Allergies Peanuts [peanut oil] and Prednisone  No family history on file.  Social History Social History   Tobacco Use  . Smoking status: Current Every Day Smoker    Packs/day: 0.10    Types: Cigarettes  . Smokeless tobacco: Never Used  Substance Use Topics  . Alcohol use: Yes    Comment: occasional  . Drug use: No    Review of Systems Constitutional: No fever/chills Eyes: No visual changes. ENT: positive for dental pain. positive for facial swelling. Cardiovascular: Denies chest pain. Respiratory: Denies shortness of breath. Skin: Negative for rash. Neurological: Negative for headaches, focal weakness or  numbness. ____________________________________________   PHYSICAL EXAM:  VITAL SIGNS: ED Triage Vitals [01/25/17 0625]  Enc Vitals Group     BP 135/77     Pulse Rate 72     Resp 18     Temp 98.9 F (37.2 C)     Temp Source Oral     SpO2 100 %     Weight 145 lb (65.8 kg)     Height 5\' 2"  (1.575 m)     Head Circumference      Peak Flow      Pain Score 9     Pain Loc      Pain Edu?      Excl. in [place]?    Constitutional: Alert and oriented. Well appearing and in no acute distress. Eyes: Conjunctivae are normal.  Head: Atraumatic. Nose: No congestion/rhinnorhea. Mouth/Throat: Mucous membranes are moist.  Oropharynx non-erythematous. Left lower premolar and molar have large caries present that are through the enamel and into the dentin. Moderate gum swelling around both of these teeth. No active drainage is present. Area is extremely tender. Neck: No stridor.   Hematological/Lymphatic/Immunilogical: No cervical lymphadenopathy. Cardiovascular: Normal rate, regular rhythm. Grossly normal heart sounds.  Good peripheral circulation. Respiratory: Normal respiratory effort.  No retractions. Lungs CTAB. Musculoskeletal: moves upper and lower extremities pain difficulty. Normal gait was noted. Neurologic:  Normal speech and language. No gross focal neurologic deficits are appreciated.  Skin:  Skin is warm, dry and intact.  Psychiatric: Mood and affect are normal.  Speech and behavior are normal.  ____________________________________________   LABS (all labs ordered are listed, but only abnormal results are displayed)  Labs Reviewed - No data to display  PROCEDURES  Procedure(s) performed: None  Procedures  Critical Care performed: No  ____________________________________________   INITIAL IMPRESSION / ASSESSMENT AND PLAN / ED COURSE Patient was given Rocephin 1 g IM while in the department. She was discharged with a prescription for penicillin V k 4 times a day and tramadol  as needed for pain. Patient was given a list of dental clinics to follow-up with. ____________________________________________   FINAL CLINICAL IMPRESSION(S) / ED DIAGNOSES  Final diagnoses:  Dental abscess  Pain due to dental caries     ED Discharge Orders        Ordered    penicillin v potassium (VEETID) 500 MG tablet  4 times daily     01/25/17 0836    traMADol (ULTRAM) 50 MG tablet  Every 6 hours PRN     01/25/17 0836       Note:  This document was prepared using Dragon voice recognition software and may include unintentional dictation errors.    Johnn Hai, PA-C 01/25/17 1136    Harvest Dark, MD 01/25/17 [phone]

## 2017-01-25 NOTE — Discharge Instructions (Signed)
Begin taking Pen-Vee K 4 times a day as directed for infection. Tramadol 1 every 6 hours as needed for pain. Call one of the many dental clinics listed on your discharge papers for further treatment.  OPTIONS FOR DENTAL FOLLOW UP CARE  [place] [facility] [place] [email or web address] [phone])  [facility] [phone])  [place] [phone])  [place] [phone])  [facility] [phone]) This clinic caters to the indigent population and is on a lottery system. Location: [facility] [place], [place], [place] Clinic Hours: Wednesdays from 6pm - 9pm, patients seen by a lottery system. For dates, call or go to [email or web address] Services: Cleanings, fillings and simple extractions. Payment Options: DENTAL WORK IS FREE OF CHARGE. Bring proof of income or support. Best way to get seen: Arrive at 5:15 pm - this is a lottery, NOT first come/first serve, so arriving earlier will not increase your chances of being seen.     [facility] [phone] Select option 1 for emergencies   Location: [facility], [place], [address] [place] Clinic Hours: No walk-ins accepted - call the day before to schedule an appointment. Check in times are 9:30 am and 1:30 pm. Services: Simple extractions, temporary fillings, pulpectomy/pulp debridement, uncomplicated abscess drainage. Payment Options: PAYMENT IS DUE AT THE TIME OF SERVICE.  Fee is usually $100-200, additional surgical procedures (e.g. abscess drainage) may be extra. Cash, checks, Visa/MasterCard accepted.  Can file Medicaid if patient is covered for dental - patient should call case worker to check. No discount for [facility]  patients. Best way to get seen: MUST call the day before and get onto the schedule. Can usually be seen the next 1-2 days. No walk-ins accepted.     [place] [phone]   Location: [place], [place] Clinic Hours: M, W, Th, F 8am or 1:30pm, Tues 9a or 1:30 - first come/first served. Services: Simple extractions, temporary fillings, uncomplicated abscess drainage.  You do not need to be an [facility] resident. Payment Options: PAYMENT IS DUE AT THE TIME OF SERVICE. Dental insurance, otherwise sliding scale - bring proof of income or support. Depending on income and treatment needed, cost is usually $50-200. Best way to get seen: Arrive early as it is first come/first served.     [facility] [phone]   Location: [place] [facility] Hours: Mon-Thu 8a-5p Services: Most basic dental services including extractions and fillings. Payment Options: PAYMENT IS DUE AT THE TIME OF SERVICE. Sliding scale, up to 50% off - bring proof if income or support. Medicaid with dental option accepted. Best way to get seen: Call to schedule an appointment, can usually be seen within 2 weeks OR they will try to see walk-ins - show up at [place] or 2p (you may have to wait).     [facility] [place] RESIDENTS ONLY   Location: [facility], [place] [address] [place], [place] [address] Clinic Hours: By appointment only. Monday - Thursday 8am-5pm, Friday 8am-12pm Services: Cleanings, fillings, extractions. Payment Options: PAYMENT IS DUE AT THE TIME OF SERVICE. Cash, Visa or MasterCard. Sliding scale - $30 minimum per service. Best way to get seen: Come in to office, complete packet and make an appointment - need proof of income  or support monies for each household member and proof of [facility] residence. Usually takes about a month to  get in.     [facility] [phone]   Location: [address], [place] Clinic Hours: Walk-in Urgent Care Dental Services are offered Monday-Friday mornings only. The numbers of emergencies accepted daily is limited to the number of providers available. Maximum 15 - Mondays, Wednesdays & Thursdays Maximum 10 - Tuesdays & Fridays Services: You do not need to be a [facility] resident to be seen for a dental emergency. Emergencies are defined as pain, swelling, abnormal bleeding, or dental trauma. Walkins will receive x-rays if needed. NOTE: Dental cleaning is not an emergency. Payment Options: PAYMENT IS DUE AT THE TIME OF SERVICE. Minimum co-pay is $40.00 for uninsured patients. Minimum co-pay is $3.00 for Medicaid with dental coverage. Dental Insurance is accepted and must be presented at time of visit. Medicare does not cover dental. Forms of payment: Cash, credit card, checks. Best way to get seen: If not previously registered with the clinic, walk-in dental registration begins at 7:15 am and is on a first come/first serve basis. If previously registered with the clinic, call to make an appointment.     [facility] [place] [employer] ONLY   Location: [address] [place], [place] Clinic Hours: Mon-Thu 10a-2p Services: Extractions only! Payment Options: FREE (donations accepted) - bring proof of income or support Best way to get seen: Call and schedule an appointment OR come at 8am on the 1st Monday of every month (except for holidays) when it is first come/first served.     [facility] [phone]   Location: [place], [place] Clinic Hours: Friday mornings Services, Payment Options, Best way to get seen: Call for info

## 2017-01-25 NOTE — ED Triage Notes (Signed)
Patient with complaint of left lower dental pain that started about a week ago. Patient states that she started having facial swelling last night. Patient with moderate amount of facial swelling to left lower face.

## 2017-01-25 NOTE — ED Notes (Signed)
See triage note  States she felt some possible dental pain to left lower jaw line about 1 week ago  Woke up with swelling to left jaw this am   Afebrile and denies any trauma  Is able to swallow her secretions

## 2017-04-13 ENCOUNTER — Emergency Department
Admission: EM | Admit: 2017-04-13 | Discharge: 2017-04-13 | Disposition: A | Discharge status: Home / Self Care | Payer: BLUE CROSS/BLUE SHIELD | Attending: Emergency Medicine | Admitting: Emergency Medicine

## 2017-04-13 ENCOUNTER — Other Ambulatory Visit: Payer: Self-pay

## 2017-04-13 DIAGNOSIS — L258 Unspecified contact dermatitis due to other agents: Secondary | ICD-10-CM

## 2017-04-13 DIAGNOSIS — J45909 Unspecified asthma, uncomplicated: Secondary | ICD-10-CM | POA: Insufficient documentation

## 2017-04-13 DIAGNOSIS — F17210 Nicotine dependence, cigarettes, uncomplicated: Secondary | ICD-10-CM | POA: Insufficient documentation

## 2017-04-13 MED ORDER — PREDNISONE 20 MG PO TABS: 60.0000 mg | ORAL_TABLET | Freq: Every day | ORAL | 0 refills | Status: DC

## 2017-04-13 MED ORDER — DEXAMETHASONE 4 MG PO TABS: 4.0000 mg | ORAL_TABLET | Freq: Once | ORAL | Status: DC

## 2017-04-13 MED ORDER — DEXAMETHASONE SODIUM PHOSPHATE 10 MG/ML IJ SOLN
10.0000 mg | Freq: Once | INTRAMUSCULAR | Status: AC
  Administered 2017-04-13: 10 mg via INTRAMUSCULAR

## 2017-04-13 MED ORDER — PREDNISONE 20 MG PO TABS
60.0000 mg | ORAL_TABLET | Freq: Once | ORAL | Status: DC
Start: 2017-04-13 — End: 2017-04-13
  Filled 2017-04-13: qty 3

## 2017-04-13 MED ORDER — DIPHENHYDRAMINE HCL 25 MG PO CAPS
50.0000 mg | ORAL_CAPSULE | Freq: Once | ORAL | Status: AC
  Administered 2017-04-13: 50 mg via ORAL
  Filled 2017-04-13: qty 2

## 2017-04-13 MED ORDER — DEXAMETHASONE SODIUM PHOSPHATE 10 MG/ML IJ SOLN
INTRAMUSCULAR | Status: AC
  Administered 2017-04-13: 10 mg via INTRAMUSCULAR
  Filled 2017-04-13: qty 1

## 2017-04-13 NOTE — ED Triage Notes (Signed)
Pt arrives to ED via POV from home with c/o rash and pruritis on abdomen, bilateral flanks, and back x12 hrs. Pt reports using topical hydrocortisone without relief. Pt denies any use of new lotions, soaps, or detergents. Pt also produces and abdominal girdle/binder and reports recently wearing it and questions of it might be the cause of her s/x's. Pt is A&O, in NAD; RR even, regular, and unlabored.

## 2017-04-14 NOTE — ED Provider Notes (Signed)
[facility] Emergency Department Provider Note    First MD Initiated Contact with Patient 04/13/17 [phone]     (approximate)  I have reviewed the triage vital signs and the nursing notes.   HISTORY  Chief Complaint Rash    HPI Carolyn Bryant is a 37 y.o. female presents to the emergency department with acute onset of pruritic rash on the abdomen flanks back times 12 hours.  Patient states that she is attempted to apply a hydrocortisone to the area without any relief.  Patient states that the rash is located in the area where she was wearing her abdominal binder.  Patient denies any fever.  Patient states a similar rash occurred the last time she wore her abdominal binder.  Past Medical History:  Diagnosis Date  . Asthma     There are no active problems to display for this patient.   Past Surgical History:  Procedure Laterality Date  . TUBAL LIGATION      Prior to Admission medications   Medication Sig Start Date End Date Taking? Authorizing Provider  penicillin v potassium (VEETID) 500 MG tablet Take 1 tablet (500 mg total) by mouth 4 (four) times daily. 01/25/17   Johnn Hai, PA-C  traMADol (ULTRAM) 50 MG tablet Take 1 tablet (50 mg total) by mouth every 6 (six) hours as needed. 01/25/17   Johnn Hai, PA-C    Allergies Peanuts [peanut oil] and Prednisone  No family history on file.  Social History Social History   Tobacco Use  . Smoking status: Current Every Day Smoker    Packs/day: 0.10    Types: Cigarettes  . Smokeless tobacco: Never Used  Substance Use Topics  . Alcohol use: Yes    Comment: occasional  . Drug use: No    Review of Systems Constitutional: No fever/chills Eyes: No visual changes. ENT: No sore throat. Cardiovascular: Denies chest pain. Respiratory: Denies shortness of breath. Gastrointestinal: No abdominal pain.  No nausea, no vomiting.  No diarrhea.  No constipation. Genitourinary: Negative for  dysuria. Musculoskeletal: Negative for neck pain.  Negative for back pain. Integumentary: Positive for for rash and pruritus Neurological: Negative for headaches, focal weakness or numbness.  ____________________________________________   PHYSICAL EXAM:  VITAL SIGNS: ED Triage Vitals  Enc Vitals Group     BP 04/13/17 0452 123/90     Pulse Rate 04/13/17 0452 84     Resp 04/13/17 0452 18     Temp 04/13/17 0452 98 F (36.7 C)     Temp Source 04/13/17 0452 Oral     SpO2 04/13/17 0452 100 %     Weight 04/13/17 0451 63.5 kg (140 lb)     Height 04/13/17 0451 1.575 m (5\' 2" )     Head Circumference --      Peak Flow --      Pain Score 04/13/17 0451 7     Pain Loc --      Pain Edu? --      Excl. in [place]? --     Constitutional: Alert and oriented. Well appearing and in no acute distress. Eyes: Conjunctivae are normal.  Head: Atraumatic. Mouth/Throat: Mucous membranes are moist. Oropharynx non-erythematous. Neck: No stridor.   Cardiovascular: Normal rate, regular rhythm. Good peripheral circulation. Grossly normal heart sounds. Respiratory: Normal respiratory effort.  No retractions. Lungs CTAB. Gastrointestinal: Soft and nontender. No distention.  Musculoskeletal: No lower extremity tenderness nor edema. No gross deformities of extremities. Neurologic:  Normal speech and language.  No gross focal neurologic deficits are appreciated.  Skin: Circumferential erythematous maculopapular rash noted on the patient's abdomen flank and back consistent with the exact dimensions of the patient's abdominal binder Psychiatric: Mood and affect are normal. Speech and behavior are normal.      Procedures   ____________________________________________   INITIAL IMPRESSION / ASSESSMENT AND PLAN / ED COURSE  As part of my medical decision making, I reviewed the following data within the electronic MEDICAL RECORD NUMBER 37 year old female presented with above-stated history and physical exam  consistent with contact dermatitis most likely secondary to the patient's abdominal binder.  Patient given Benadryl in the emergency department and Decadron secondary to prednisone allergy.  Patient pruritus improved.  I advised the patient to discontinue use of this abdominal binder. ____________________________________________  FINAL CLINICAL IMPRESSION(S) / ED DIAGNOSES  Final diagnoses:  Contact dermatitis due to other agent, unspecified contact dermatitis type     MEDICATIONS GIVEN DURING THIS VISIT:  Medications  diphenhydrAMINE (BENADRYL) capsule 50 mg (50 mg Oral Given 04/13/17 0557)  dexamethasone (DECADRON) injection 10 mg (10 mg Intramuscular Given 04/13/17 5465)     ED Discharge Orders        Ordered    predniSONE (DELTASONE) 20 MG tablet  Daily,   Status:  Discontinued     04/13/17 0630       Note:  This document was prepared using Dragon voice recognition software and may include unintentional dictation errors.    Gregor Hams, MD 04/14/17 [phone]

## 2017-07-27 ENCOUNTER — Encounter: Payer: Self-pay | Admitting: Emergency Medicine

## 2017-07-27 ENCOUNTER — Emergency Department: Payer: Self-pay

## 2017-07-27 ENCOUNTER — Other Ambulatory Visit: Payer: Self-pay

## 2017-07-27 ENCOUNTER — Emergency Department
Admission: EM | Admit: 2017-07-27 | Discharge: 2017-07-27 | Disposition: A | Discharge status: Home / Self Care | Payer: Self-pay | Attending: Emergency Medicine | Admitting: Emergency Medicine

## 2017-07-27 DIAGNOSIS — J45909 Unspecified asthma, uncomplicated: Secondary | ICD-10-CM | POA: Insufficient documentation

## 2017-07-27 DIAGNOSIS — F17210 Nicotine dependence, cigarettes, uncomplicated: Secondary | ICD-10-CM | POA: Insufficient documentation

## 2017-07-27 DIAGNOSIS — Z91010 Allergy to peanuts: Secondary | ICD-10-CM | POA: Insufficient documentation

## 2017-07-27 DIAGNOSIS — M25561 Pain in right knee: Secondary | ICD-10-CM | POA: Insufficient documentation

## 2017-07-27 MED ORDER — MELOXICAM 15 MG PO TABS: 15.0000 mg | ORAL_TABLET | Freq: Every day | ORAL | 0 refills | Status: DC

## 2017-07-27 NOTE — ED Provider Notes (Signed)
[facility] Emergency Department Provider Note ____________________________________________  Time seen: Approximately 5:36 PM  I have reviewed the triage vital signs and the nursing notes.   HISTORY  Chief Complaint Knee Pain    HPI Carolyn Bryant is a 37 y.o. female who presents to the emergency department for evaluation and treatment of knee pain. While exercising yesterday, she hit her knee on the stationary bike. Since then, she has had pain that has not been relieved with heat, ice, and Aleve. She has had generalized knee pain in the past.   Past Medical History:  Diagnosis Date  . Asthma     There are no active problems to display for this patient.   Past Surgical History:  Procedure Laterality Date  . TUBAL LIGATION      Prior to Admission medications   Medication Sig Start Date End Date Taking? Authorizing Provider  meloxicam (MOBIC) 15 MG tablet Take 1 tablet (15 mg total) by mouth daily. 07/27/17   Ciro Tashiro, Johnette Abraham B, FNP  penicillin v potassium (VEETID) 500 MG tablet Take 1 tablet (500 mg total) by mouth 4 (four) times daily. 01/25/17   Johnn Hai, PA-C  traMADol (ULTRAM) 50 MG tablet Take 1 tablet (50 mg total) by mouth every 6 (six) hours as needed. 01/25/17   Johnn Hai, PA-C    Allergies Peanuts [peanut oil] and Prednisone  No family history on file.  Social History Social History   Tobacco Use  . Smoking status: Current Every Day Smoker    Packs/day: 0.10    Types: Cigarettes  . Smokeless tobacco: Never Used  Substance Use Topics  . Alcohol use: Yes    Comment: occasional  . Drug use: No    Review of Systems Constitutional: Negative for fever. Cardiovascular: Negative for chest pain. Respiratory: Negative for shortness of breath. Musculoskeletal: Positive for  Skin: Negative for open wound or lesion Neurological: Negative for decrease in sensation  ____________________________________________   PHYSICAL  EXAM:  VITAL SIGNS: ED Triage Vitals  Enc Vitals Group     BP 07/27/17 1545 125/84     Pulse Rate 07/27/17 1545 60     Resp 07/27/17 1545 16     Temp 07/27/17 1545 98.5 F (36.9 C)     Temp Source 07/27/17 1545 Oral     SpO2 07/27/17 1545 99 %     Weight 07/27/17 1546 155 lb (70.3 kg)     Height 07/27/17 1546 5\' 2"  (1.575 m)     Head Circumference --      Peak Flow --      Pain Score 07/27/17 1546 6     Pain Loc --      Pain Edu? --      Excl. in [place]? --     Constitutional: Alert and oriented. Well appearing and in no acute distress. Eyes: Conjunctivae are clear without discharge or drainage Head: Atraumatic Neck: Supple Respiratory: No cough. Respirations are even and unlabored. Musculoskeletal: Right knee pain increases with valgus stress or.  There is no obvious joint effusion.  Patella is midline.  Lachman's is negative.  Negative straight leg raise.  Neurologic: Awake, alert, oriented x4.  Motor and sensory function is intact. Skin: No open wound or lesion on exposed skin surfaces. Psychiatric: Affect and behavior are appropriate.  ____________________________________________   LABS (all labs ordered are listed, but only abnormal results are displayed)  Labs Reviewed - No data to display ____________________________________________  RADIOLOGY  Image of  the right knee does not indicate any acute bony abnormality per radiology. ____________________________________________   PROCEDURES  Procedures  ____________________________________________   INITIAL IMPRESSION / ASSESSMENT AND PLAN / ED COURSE  Elysse Polidore How is a 37 y.o. who presents to the emergency department for treatment and evaluation of right knee pain.  She will be given a prescription for meloxicam and encouraged to rest, ice, and elevate the knee.  She was encouraged to avoid high impact exercise until pain has improved.  Patient instructed to follow-up with orthopedics if not improving over the  week.Marland Kitchen  She was also instructed to return to the emergency department for symptoms that change or worsen if unable schedule an appointment with orthopedics or primary care.  Medications - No data to display  Pertinent labs & imaging results that were available during my care of the patient were reviewed by me and considered in my medical decision making (see chart for details).  _________________________________________   FINAL CLINICAL IMPRESSION(S) / ED DIAGNOSES  Final diagnoses:  Acute pain of right knee    ED Discharge Orders        Ordered    meloxicam (MOBIC) 15 MG tablet  Daily     07/27/17 1741       If controlled substance prescribed during this visit, 12 month history viewed on the [facility] prior to issuing an initial prescription for Schedule II or III opiod.    Victorino Dike, FNP 07/27/17 2021    Eula Listen, MD 07/27/17 [phone]

## 2017-07-27 NOTE — ED Triage Notes (Signed)
PT to ED via POV with c/o RT knee pain after hitting it on gym equipment yesterday . PT ambulatory, no swelling or deformity noted.

## 2017-07-27 NOTE — ED Notes (Signed)
Pt c/o right knee pain for about 2 weeks, states she was recently working out and hit something on her knee. Pt ambulatory to room without difficulty.  Painful when bearing weight.

## 2018-10-18 ENCOUNTER — Other Ambulatory Visit: Payer: Self-pay

## 2018-10-18 ENCOUNTER — Emergency Department (HOSPITAL_COMMUNITY): Payer: Self-pay

## 2018-10-18 ENCOUNTER — Emergency Department (HOSPITAL_COMMUNITY)
Admission: EM | Admit: 2018-10-18 | Discharge: 2018-10-18 | Disposition: A | Discharge status: Home / Self Care | Payer: Self-pay | Attending: Emergency Medicine | Admitting: Emergency Medicine

## 2018-10-18 ENCOUNTER — Encounter (HOSPITAL_COMMUNITY): Payer: Self-pay | Admitting: Emergency Medicine

## 2018-10-18 DIAGNOSIS — Y9389 Activity, other specified: Secondary | ICD-10-CM | POA: Insufficient documentation

## 2018-10-18 DIAGNOSIS — T148XXA Other injury of unspecified body region, initial encounter: Secondary | ICD-10-CM

## 2018-10-18 DIAGNOSIS — Y999 Unspecified external cause status: Secondary | ICD-10-CM | POA: Insufficient documentation

## 2018-10-18 DIAGNOSIS — Z91010 Allergy to peanuts: Secondary | ICD-10-CM | POA: Insufficient documentation

## 2018-10-18 DIAGNOSIS — Y929 Unspecified place or not applicable: Secondary | ICD-10-CM | POA: Insufficient documentation

## 2018-10-18 DIAGNOSIS — J45909 Unspecified asthma, uncomplicated: Secondary | ICD-10-CM | POA: Insufficient documentation

## 2018-10-18 DIAGNOSIS — F17210 Nicotine dependence, cigarettes, uncomplicated: Secondary | ICD-10-CM | POA: Insufficient documentation

## 2018-10-18 DIAGNOSIS — S60221A Contusion of right hand, initial encounter: Secondary | ICD-10-CM | POA: Insufficient documentation

## 2018-10-18 DIAGNOSIS — Y29XXXA Contact with blunt object, undetermined intent, initial encounter: Secondary | ICD-10-CM | POA: Insufficient documentation

## 2018-10-18 NOTE — ED Triage Notes (Signed)
Injured right hand banging on a door/window-- injury to right hand/little finger, small abrasion to little finger. Slight swelling

## 2018-10-18 NOTE — ED Notes (Signed)
Patient verbalizes understanding of discharge instructions. Opportunity for questioning and answers were provided. Armband removed by staff, pt discharged from ED. Ambulated out to lobby  

## 2018-10-18 NOTE — Discharge Instructions (Addendum)

## 2018-10-18 NOTE — ED Provider Notes (Signed)
[place] EMERGENCY DEPARTMENT Provider Note   CSN: [id number] Arrival date & time: 10/18/18  1215     History   Chief Complaint Chief Complaint  Patient presents with  . Hand Injury    HPI Carolyn Bryant is a 38 y.o. female.     HPI  38 year old female with a history of asthma presenting to the emergency department today for evaluation of right hand injury.  States yesterday she became angry and was pounding on a door.  She had a cut up a credit card in her hands that caused several abrasions to her right fifth digit.  Today has had constant pain to this area.  Denies other injuries.  States her Tdap is up-to-date.  Past Medical History:  Diagnosis Date  . Asthma     There are no active problems to display for this patient.   Past Surgical History:  Procedure Laterality Date  . TUBAL LIGATION       OB History    Gravida  3   Para  3   Term  3   Preterm      AB  0   Living  2     SAB  0   TAB      Ectopic      Multiple      Live Births               Home Medications    Prior to Admission medications   Medication Sig Start Date End Date Taking? Authorizing Provider  meloxicam (MOBIC) 15 MG tablet Take 1 tablet (15 mg total) by mouth daily. 07/27/17   Triplett, Johnette Abraham B, FNP  penicillin v potassium (VEETID) 500 MG tablet Take 1 tablet (500 mg total) by mouth 4 (four) times daily. 01/25/17   Johnn Hai, PA-C  traMADol (ULTRAM) 50 MG tablet Take 1 tablet (50 mg total) by mouth every 6 (six) hours as needed. 01/25/17   Johnn Hai, PA-C    Family History No family history on file.  Social History Social History   Tobacco Use  . Smoking status: Current Every Day Smoker    Packs/day: 0.10    Types: Cigarettes  . Smokeless tobacco: Never Used  Substance Use Topics  . Alcohol use: Yes    Comment: occasional  . Drug use: No     Allergies   Peanuts [peanut oil] and Prednisone   Review of Systems Review  of Systems  Constitutional: Negative for fever.  Musculoskeletal:       Right hand pain  Skin: Positive for wound.     Physical Exam Updated Vital Signs BP (!) 164/113   Pulse 93   Temp 98.6 F (37 C) (Oral)   Resp 16   Ht 5\' 2"  (1.575 m)   Wt 65.8 kg   LMP 10/04/2018   SpO2 100%   BMI 26.52 kg/m   Physical Exam Vitals signs and nursing note reviewed.  Constitutional:      General: She is not in acute distress.    Appearance: She is well-developed.  HENT:     Head: Normocephalic and atraumatic.  Eyes:     Conjunctiva/sclera: Conjunctivae normal.  Neck:     Musculoskeletal: Neck supple.  Cardiovascular:     Rate and Rhythm: Normal rate.  Pulmonary:     Effort: Pulmonary effort is normal.  Musculoskeletal: Normal range of motion.     Comments: ttp to the right 5th digit and lateral  hand  Skin:    General: Skin is warm and dry.     Comments: Multiple small superficial abrasions to the right 5th digit  Neurological:     Mental Status: She is alert.     ED Treatments / Results  Labs (all labs ordered are listed, but only abnormal results are displayed) Labs Reviewed - No data to display  EKG None  Radiology Dg Hand Complete Right  Result Date: 10/18/2018 CLINICAL DATA:  Injury EXAM: RIGHT HAND - COMPLETE 3+ VIEW COMPARISON:  None. FINDINGS: No fracture or dislocation of the right hand. Joint spaces are well preserved. Soft tissues are unremarkable. IMPRESSION: No fracture or dislocation of the right hand. Joint spaces are well preserved. Electronically Signed   By: Eddie Candle M.D.   On: 10/18/2018 13:09    Procedures Procedures (including critical care time)  Medications Ordered in ED Medications - No data to display   Initial Impression / Assessment and Plan / ED Course  I have reviewed the triage vital signs and the nursing notes.  Pertinent labs & imaging results that were available during my care of the patient were reviewed by me and considered  in my medical decision making (see chart for details).      Final Clinical Impressions(s) / ED Diagnoses   Final diagnoses:  Contusion of right hand, initial encounter  Abrasion   38 year old female with a history of asthma presenting to the emergency department today for evaluation of right hand injury.  States yesterday she became angry and was pounding on a door.  She had a cut up a credit card in her hands that caused several abrasions to her right fifth digit.  Today has had constant pain to this area.  Denies other injuries.  States her Tdap is up-to-date.   No fracture or dislocation of the right hand. Joint spaces are well preserved.  Advised tylenol, motrin, and advised on wound care. Return precautions discussed. She voices understanding and is in agreement. All questions answered.   ED Discharge Orders    None       Bishop Dublin 10/18/18 1420    Davonna Belling, MD 10/18/18 1521

## 2018-12-27 ENCOUNTER — Other Ambulatory Visit: Payer: Self-pay

## 2018-12-27 ENCOUNTER — Encounter (HOSPITAL_COMMUNITY): Payer: Self-pay

## 2018-12-27 ENCOUNTER — Emergency Department (HOSPITAL_COMMUNITY)
Admission: EM | Admit: 2018-12-27 | Discharge: 2018-12-27 | Disposition: A | Discharge status: Home / Self Care | Payer: Medicaid Other | Attending: Emergency Medicine | Admitting: Emergency Medicine

## 2018-12-27 ENCOUNTER — Emergency Department (HOSPITAL_COMMUNITY): Payer: Medicaid Other

## 2018-12-27 DIAGNOSIS — Z79899 Other long term (current) drug therapy: Secondary | ICD-10-CM | POA: Insufficient documentation

## 2018-12-27 DIAGNOSIS — Z91010 Allergy to peanuts: Secondary | ICD-10-CM | POA: Insufficient documentation

## 2018-12-27 DIAGNOSIS — M25461 Effusion, right knee: Secondary | ICD-10-CM | POA: Insufficient documentation

## 2018-12-27 DIAGNOSIS — F17210 Nicotine dependence, cigarettes, uncomplicated: Secondary | ICD-10-CM | POA: Insufficient documentation

## 2018-12-27 DIAGNOSIS — J45909 Unspecified asthma, uncomplicated: Secondary | ICD-10-CM | POA: Insufficient documentation

## 2018-12-27 DIAGNOSIS — M25561 Pain in right knee: Secondary | ICD-10-CM

## 2018-12-27 NOTE — ED Notes (Signed)
Patient verbalizes understanding of discharge instructions. Opportunity for questioning and answers were provided. Armband removed by staff, pt discharged from ED.  

## 2018-12-27 NOTE — Discharge Instructions (Addendum)
As we discussed if you develop significant redness of the knee, fevers, are unable to bend the knee or walk on it or have other concerns please seek additional medical care and evaluation.    Please take Ibuprofen (Advil, motrin) and Tylenol (acetaminophen) to relieve your pain.  You may take up to 600 MG (3 pills) of normal strength ibuprofen every 8 hours as needed.  In between doses of ibuprofen you make take tylenol, up to 1,000 mg (two extra strength pills).  Do not take more than 3,000 mg tylenol in a 24 hour period.  Please check all medication labels as many medications such as pain and cold medications may contain tylenol.  Do not drink alcohol while taking these medications.  Do not take other NSAID'S while taking ibuprofen (such as aleve or naproxen).  Please take ibuprofen with food to decrease stomach upset.

## 2018-12-27 NOTE — ED Triage Notes (Signed)
Patient complains of right knee pain x 1 week. Describes pain as worse with ambulation. denis known injury

## 2018-12-27 NOTE — ED Provider Notes (Signed)
[place] EMERGENCY DEPARTMENT Provider Note   CSN: [id number] Arrival date & time: 12/27/18  0815     History   Chief Complaint No chief complaint on file.   HPI Carolyn Bryant is a 38 y.o. female who presents today for evaluation of right knee pain.  Chart review shows that she has been seen previously in the ER for right knee pain without specific injury in the 17 and 2019.  She denies any specific injury.  She reports that her pain has been present for about 1 week and she feels swelling in the knee.  She denies any fevers.  She denies any consistent redness of the knee.  She has attempted rest without significant relief.  She has never seen an orthopedist before for this.  She denies any history of gout or pseudogout.  Her pain is made worse with weightbearing, and touch.  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     HPI  Past Medical History:  Diagnosis Date  . Asthma     There are no active problems to display for this patient.   Past Surgical History:  Procedure Laterality Date  . TUBAL LIGATION       OB History    Gravida  3   Para  3   Term  3   Preterm      AB  0   Living  2     SAB  0   TAB      Ectopic      Multiple      Live Births               Home Medications    Prior to Admission medications   Medication Sig Start Date End Date Taking? Authorizing Provider  meloxicam (MOBIC) 15 MG tablet Take 1 tablet (15 mg total) by mouth daily. 07/27/17   Triplett, Johnette Abraham B, FNP  penicillin v potassium (VEETID) 500 MG tablet Take 1 tablet (500 mg total) by mouth 4 (four) times daily. 01/25/17   Johnn Hai, PA-C  traMADol (ULTRAM) 50 MG tablet Take 1 tablet (50 mg total) by mouth every 6 (six) hours as needed. 01/25/17   Johnn Hai, PA-C    Family History No family history on file.  Social History Social History   Tobacco Use  . Smoking status:  Current Every Day Smoker    Packs/day: 0.10    Types: Cigarettes  . Smokeless tobacco: Never Used  Substance Use Topics  . Alcohol use: Yes    Comment: occasional  . Drug use: No     Allergies   Peanuts [peanut oil] and Prednisone   Review of Systems Review of Systems  Constitutional: Negative for chills and fever.  Musculoskeletal:       Right knee pain and swelling  Skin: Negative for color change and wound.  All other systems reviewed and are negative.    Physical Exam Updated Vital Signs BP 116/69 (BP Location: Right Arm)   Pulse 63   Temp 98.5 F (36.9 C) (Oral)   Resp 16   SpO2 93%   Physical Exam Vitals signs and nursing note reviewed.  Constitutional:      General: She is not in acute distress.    Appearance: She is not ill-appearing.  HENT:     Head: Normocephalic.  Cardiovascular:     Rate and Rhythm: Normal  rate.     Pulses: Normal pulses.     Comments: 2+ right DP/PT pulses Pulmonary:     Effort: Pulmonary effort is normal. No respiratory distress.  Musculoskeletal:     Comments: Right knee has moderate joint effusion.  She is able to bend her knee to 90 degrees with mild pain.  She is able to bear weight and ambulate.  Knee is grossly stable to anterior/posterior drawer test and valgus/varus stress.  She does not have any significant right calf pain or tenderness.    Skin:    Comments: Right knee examined without lacerations, abrasions, contusions, erythema.  Right and left knees are same temperature to touch.    Neurological:     Mental Status: She is alert.     Comments: Sensation intact to right lower extremity to light touch.      ED Treatments / Results  Labs (all labs ordered are listed, but only abnormal results are displayed) Labs Reviewed - No data to display  EKG None  Radiology Dg Knee Complete 4 Views Right  Result Date: 12/27/2018 CLINICAL DATA:  Knee pain EXAM: RIGHT KNEE - COMPLETE 4+ VIEW COMPARISON:  07/27/2017,  09/19/2015 FINDINGS: No fracture or dislocation of the right knee. The joint spaces are well preserved. There is a moderate, nonspecific knee joint effusion. 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 IMPRESSION: 1. No fracture or dislocation of the right knee. Joint spaces are well preserved. 2. Moderate nonspecific knee joint effusion. 3. Stable benign cortical based lesion of the posteromedial right tibial metadiaphysis. Electronically Signed   By: Eddie Candle M.D.   On: 12/27/2018 09:20    Procedures Procedures (including critical care time)  Medications Ordered in ED Medications - No data to display   Initial Impression / Assessment and Plan / ED Course  I have reviewed the triage vital signs and the nursing notes.  Pertinent labs & imaging results that were available during my care of the patient were reviewed by me and considered in my medical decision making (see chart for details).       Presents today for evaluation of atraumatic right knee pain.  On exam her right knee has a moderate effusion and is grossly stable.  X-rays were obtained showing moderate joint effusion with a stable benign right tibial lesion.  Patient was made aware of this incidental finding.  Do not suspect septic arthritis as she does not have any skin breaks, her symptoms have been going on for 1 week, the knee is not significantly red or hot, she is able to bend it to 90 degrees and is able to bear weight in addition to being afebrile and generally well-appearing.  Do not suspect gout given history and physical without significant pain, inability to bear weight, redness or abnormal warmth. I suspect that she may have internal knee derangement that intermittently gets inflamed causing her to have have intermittent episodes of knee pain over the past few years.  She is given a knee sleeve and crutches.  Recommended rice.   Recommended outpatient orthopedics follow-up.  Specific return precautions were discussed with patient who states their understanding.  At the time of discharge patient denied any unaddressed complaints or concerns.  Patient is agreeable for discharge home.    Final Clinical Impressions(s) / ED Diagnoses   Final diagnoses:  Acute pain of right knee  Effusion of right knee    ED Discharge Orders  None       Lorin Glass, [place] 12/27/18 1108    Tegeler, Gwenyth Allegra, MD 12/27/18 [phone]

## 2019-09-15 ENCOUNTER — Other Ambulatory Visit: Payer: Self-pay

## 2019-09-15 ENCOUNTER — Encounter (HOSPITAL_COMMUNITY): Payer: Self-pay | Admitting: Emergency Medicine

## 2019-09-15 ENCOUNTER — Emergency Department (HOSPITAL_COMMUNITY)
Admission: EM | Admit: 2019-09-15 | Discharge: 2019-09-15 | Disposition: A | Discharge status: Home / Self Care | Payer: Medicaid Other | Attending: Emergency Medicine | Admitting: Emergency Medicine

## 2019-09-15 DIAGNOSIS — R2231 Localized swelling, mass and lump, right upper limb: Secondary | ICD-10-CM | POA: Insufficient documentation

## 2019-09-15 DIAGNOSIS — Z5321 Procedure and treatment not carried out due to patient leaving prior to being seen by health care provider: Secondary | ICD-10-CM | POA: Insufficient documentation

## 2019-09-15 DIAGNOSIS — M79641 Pain in right hand: Secondary | ICD-10-CM | POA: Insufficient documentation

## 2019-09-15 NOTE — ED Notes (Signed)
Pt states that she needs to get home in time to pay her bills, pt informed of possibly being moved to fast track at 7. Pt states she does not want to wait and ambulated out of waiting room.

## 2019-09-15 NOTE — ED Triage Notes (Signed)
Pt presents with "bump" to her right hand x 1 year, c/o pain to the area x 2 weeks. Pt states she has used OTC creams with no change.

## 2020-02-17 ENCOUNTER — Emergency Department (HOSPITAL_COMMUNITY): Payer: PRIVATE HEALTH INSURANCE

## 2020-02-17 ENCOUNTER — Emergency Department (HOSPITAL_COMMUNITY)
Admission: EM | Admit: 2020-02-17 | Discharge: 2020-02-17 | Disposition: A | Discharge status: Home / Self Care | Payer: PRIVATE HEALTH INSURANCE | Attending: Emergency Medicine | Admitting: Emergency Medicine

## 2020-02-17 ENCOUNTER — Other Ambulatory Visit: Payer: Self-pay

## 2020-02-17 DIAGNOSIS — Z91010 Allergy to peanuts: Secondary | ICD-10-CM | POA: Insufficient documentation

## 2020-02-17 DIAGNOSIS — J45909 Unspecified asthma, uncomplicated: Secondary | ICD-10-CM | POA: Insufficient documentation

## 2020-02-17 DIAGNOSIS — F17210 Nicotine dependence, cigarettes, uncomplicated: Secondary | ICD-10-CM | POA: Insufficient documentation

## 2020-02-17 DIAGNOSIS — R1032 Left lower quadrant pain: Secondary | ICD-10-CM | POA: Diagnosis not present

## 2020-02-17 LAB — URINALYSIS, ROUTINE W REFLEX MICROSCOPIC
Bilirubin Urine: NEGATIVE
Glucose, UA: NEGATIVE mg/dL
Hgb urine dipstick: NEGATIVE
Ketones, ur: 20 mg/dL — AB
Nitrite: NEGATIVE
Protein, ur: NEGATIVE mg/dL
Specific Gravity, Urine: 1.024 (ref 1.005–1.030)
pH: 5 (ref 5.0–8.0)

## 2020-02-17 LAB — COMPREHENSIVE METABOLIC PANEL
ALT: 11 U/L (ref 0–44)
AST: 16 U/L (ref 15–41)
Albumin: 4 g/dL (ref 3.5–5.0)
Alkaline Phosphatase: 49 U/L (ref 38–126)
Anion gap: 10 (ref 5–15)
BUN: 6 mg/dL (ref 6–20)
CO2: 19 mmol/L — ABNORMAL LOW (ref 22–32)
Calcium: 9.4 mg/dL (ref 8.9–10.3)
Chloride: 106 mmol/L (ref 98–111)
Creatinine, Ser: 0.88 mg/dL (ref 0.44–1.00)
GFR, Estimated: >60 mL/min (ref >60)
Glucose, Bld: 116 mg/dL — ABNORMAL HIGH (ref 70–99)
Potassium: 4.1 mmol/L (ref 3.5–5.1)
Sodium: 135 mmol/L (ref 135–145)
Total Bilirubin: 0.4 mg/dL (ref 0.3–1.2)
Total Protein: 7.8 g/dL (ref 6.5–8.1)

## 2020-02-17 LAB — CBC
HCT: 29.9 % — ABNORMAL LOW (ref 36.0–46.0)
Hemoglobin: 9.2 g/dL — ABNORMAL LOW (ref 12.0–15.0)
MCH: 26.4 pg (ref 26.0–34.0)
MCHC: 30.8 g/dL (ref 30.0–36.0)
MCV: 85.9 fL (ref 80.0–100.0)
Platelets: 354 10*3/uL (ref 150–400)
RBC: 3.48 MIL/uL — ABNORMAL LOW (ref 3.87–5.11)
RDW: 18.8 % — ABNORMAL HIGH (ref 11.5–15.5)
WBC: 8.2 10*3/uL (ref 4.0–10.5)
nRBC: 0 % (ref 0.0–0.2)

## 2020-02-17 LAB — I-STAT BETA HCG BLOOD, ED (MC, WL, AP ONLY): I-stat hCG, quantitative: 7.5 m[IU]/mL — ABNORMAL HIGH (ref <5)

## 2020-02-17 MED ORDER — NAPROXEN 500 MG PO TABS: 500.0000 mg | ORAL_TABLET | Freq: Two times a day (BID) | ORAL | 0 refills | Status: AC

## 2020-02-17 MED ORDER — HYDROMORPHONE HCL 1 MG/ML IJ SOLN
0.5000 mg | Freq: Once | INTRAMUSCULAR | Status: AC
  Administered 2020-02-17: 0.5 mg via INTRAVENOUS
  Filled 2020-02-17: qty 1

## 2020-02-17 NOTE — Discharge Instructions (Addendum)
Call your primary care doctor or specialist as discussed in the next 2-3 days.   Return immediately back to the ER if:  Your symptoms worsen within the next 12-24 hours. You develop new symptoms such as new fevers, persistent vomiting, new pain, shortness of breath, or new weakness or numbness, or if you have any other concerns.  

## 2020-02-17 NOTE — ED Triage Notes (Signed)
Pt came in via POV with twisting, cramping left sided pelvic/abdominal pain that radiates to her back. Pt took an extra strength Tylenol approx 1800. Pt states she got off of her menstrual cycle yesterday, and it started soon after that.

## 2020-02-17 NOTE — ED Provider Notes (Signed)
[facility] DEPT Provider Note   CSN: [id number] Arrival date & time: 02/17/20  1908     History Chief Complaint  Patient presents with  . Abdominal Pain    Carolyn Bryant is a 40 y.o. female.  Patient presents complaint of left lower abdominal pain.  Symptoms started earlier today.  She states that she just had her menses and finished her menstrual period yesterday.  She was doing well at that time however earlier this noon she had left progressive left lower abdominal pain describes a sharp and aching nonradiating.  She had one episode of vomiting nonbloody nonbilious.  Denies fevers no cough no diarrhea.        Past Medical History:  Diagnosis Date  . Asthma     There are no problems to display for this patient.   Past Surgical History:  Procedure Laterality Date  . TUBAL LIGATION       OB History    Gravida  3   Para  3   Term  3   Preterm      AB  0   Living  2     SAB  0   IAB      Ectopic      Multiple      Live Births              No family history on file.  Social History   Tobacco Use  . Smoking status: Current Every Day Smoker    Packs/day: 0.10    Types: Cigarettes  . Smokeless tobacco: Never Used  Vaping Use  . Vaping Use: Never used  Substance Use Topics  . Alcohol use: Yes    Comment: occasional  . Drug use: No    Home Medications Prior to Admission medications   Medication Sig Start Date End Date Taking? Authorizing Provider  naproxen (NAPROSYN) 500 MG tablet Take 1 tablet (500 mg total) by mouth 2 (two) times daily with a meal for 6 days. 02/17/20 02/23/20 Yes Luna Fuse, MD  meloxicam (MOBIC) 15 MG tablet Take 1 tablet (15 mg total) by mouth daily. 07/27/17   Triplett, Johnette Abraham B, FNP  penicillin v potassium (VEETID) 500 MG tablet Take 1 tablet (500 mg total) by mouth 4 (four) times daily. 01/25/17   Johnn Hai, PA-C  traMADol (ULTRAM) 50 MG tablet Take 1 tablet (50 mg total) by  mouth every 6 (six) hours as needed. 01/25/17   Johnn Hai, PA-C    Allergies    Peanuts [peanut oil] and Prednisone  Review of Systems   Review of Systems  Constitutional: Negative for fever.  HENT: Negative for ear pain.   Eyes: Negative for pain.  Respiratory: Negative for cough.   Cardiovascular: Negative for chest pain.  Gastrointestinal: Positive for abdominal pain.  Genitourinary: Negative for flank pain.  Musculoskeletal: Negative for back pain.  Skin: Negative for rash.  Neurological: Negative for headaches.    Physical Exam Updated Vital Signs BP 127/79   Pulse (!) 53   Temp 98.3 F (36.8 C) (Oral)   Resp 16   Ht 5\' 2"  (1.575 m)   Wt 65.8 kg   SpO2 99%   BMI 26.52 kg/m   Physical Exam Constitutional:      General: She is not in acute distress.    Appearance: Normal appearance.  HENT:     Head: Normocephalic.     Nose: Nose normal.  Eyes:  Extraocular Movements: Extraocular movements intact.  Cardiovascular:     Rate and Rhythm: Normal rate.  Pulmonary:     Effort: Pulmonary effort is normal.  Abdominal:     Tenderness: There is abdominal tenderness in the left lower quadrant.  Musculoskeletal:        General: Normal range of motion.     Cervical back: Normal range of motion.  Neurological:     General: No focal deficit present.     Mental Status: She is alert. Mental status is at baseline.     ED Results / Procedures / Treatments   Labs (all labs ordered are listed, but only abnormal results are displayed) Labs Reviewed  URINALYSIS, ROUTINE W REFLEX MICROSCOPIC - Abnormal; Notable for the following components:      Result Value   APPearance HAZY (*)    Ketones, ur 20 (*)    Leukocytes,Ua MODERATE (*)    Bacteria, UA RARE (*)    All other components within normal limits  CBC - Abnormal; Notable for the following components:   RBC 3.48 (*)    Hemoglobin 9.2 (*)    HCT 29.9 (*)    RDW 18.8 (*)    All other components within  normal limits  COMPREHENSIVE METABOLIC PANEL - Abnormal; Notable for the following components:   CO2 19 (*)    Glucose, Bld 116 (*)    All other components within normal limits  I-STAT BETA HCG BLOOD, ED ([facility], [facility], [facility] ONLY) - Abnormal; Notable for the following components:   I-stat hCG, quantitative 7.5 (*)    All other components within normal limits    EKG None  Radiology US Transvaginal Non-OB  Result Date: 02/17/2020 CLINICAL DATA:  Left lower quadrant pain for 1 day EXAM: TRANSABDOMINAL AND TRANSVAGINAL ULTRASOUND OF PELVIS DOPPLER ULTRASOUND OF OVARIES TECHNIQUE: Both transabdominal and transvaginal ultrasound examinations of the pelvis were performed. Transabdominal technique was performed for global imaging of the pelvis including uterus, ovaries, adnexal regions, and pelvic cul-de-sac. It was necessary to proceed with endovaginal exam following the transabdominal exam to visualize the ovaries. Color and duplex Doppler ultrasound was utilized to evaluate blood flow to the ovaries. COMPARISON:  08/21/2014 FINDINGS: Uterus Measurements: 9.6 x 4.9 x 5.9 cm. = volume: 144 mL. No fibroids or other mass visualized. Endometrium Thickness: 3 mm.  No focal abnormality visualized. Right ovary Measurements: 2.9 x 1.8 x 2.4 cm. = volume: 6.5 mL. Normal appearance/no adnexal mass. Left ovary Measurements: 2.7 x 2.0 x 1.8 cm. = volume: 5.1 mL. Normal appearance/no adnexal mass. Pulsed Doppler evaluation of both ovaries demonstrates normal low-resistance arterial and venous waveforms. Other findings No abnormal free fluid. IMPRESSION: No acute abnormality noted. Electronically Signed   By: Inez Catalina M.D.   On: 02/17/2020 22:16   US Pelvis Complete  Result Date: 02/17/2020 CLINICAL DATA:  Left lower quadrant pain for 1 day EXAM: TRANSABDOMINAL AND TRANSVAGINAL ULTRASOUND OF PELVIS DOPPLER ULTRASOUND OF OVARIES TECHNIQUE: Both transabdominal and transvaginal ultrasound examinations of the pelvis were  performed. Transabdominal technique was performed for global imaging of the pelvis including uterus, ovaries, adnexal regions, and pelvic cul-de-sac. It was necessary to proceed with endovaginal exam following the transabdominal exam to visualize the ovaries. Color and duplex Doppler ultrasound was utilized to evaluate blood flow to the ovaries. COMPARISON:  08/21/2014 FINDINGS: Uterus Measurements: 9.6 x 4.9 x 5.9 cm. = volume: 144 mL. No fibroids or other mass visualized. Endometrium Thickness: 3 mm.  No focal abnormality visualized. Right  ovary Measurements: 2.9 x 1.8 x 2.4 cm. = volume: 6.5 mL. Normal appearance/no adnexal mass. Left ovary Measurements: 2.7 x 2.0 x 1.8 cm. = volume: 5.1 mL. Normal appearance/no adnexal mass. Pulsed Doppler evaluation of both ovaries demonstrates normal low-resistance arterial and venous waveforms. Other findings No abnormal free fluid. IMPRESSION: No acute abnormality noted. Electronically Signed   By: Inez Catalina M.D.   On: 02/17/2020 22:16   [place] Art/Ven Flow Abd Pelv Doppler  Result Date: 02/17/2020 CLINICAL DATA:  Left lower quadrant pain for 1 day EXAM: TRANSABDOMINAL AND TRANSVAGINAL ULTRASOUND OF PELVIS DOPPLER ULTRASOUND OF OVARIES TECHNIQUE: Both transabdominal and transvaginal ultrasound examinations of the pelvis were performed. Transabdominal technique was performed for global imaging of the pelvis including uterus, ovaries, adnexal regions, and pelvic cul-de-sac. It was necessary to proceed with endovaginal exam following the transabdominal exam to visualize the ovaries. Color and duplex Doppler ultrasound was utilized to evaluate blood flow to the ovaries. COMPARISON:  08/21/2014 FINDINGS: Uterus Measurements: 9.6 x 4.9 x 5.9 cm. = volume: 144 mL. No fibroids or other mass visualized. Endometrium Thickness: 3 mm.  No focal abnormality visualized. Right ovary Measurements: 2.9 x 1.8 x 2.4 cm. = volume: 6.5 mL. Normal appearance/no adnexal mass. Left ovary  Measurements: 2.7 x 2.0 x 1.8 cm. = volume: 5.1 mL. Normal appearance/no adnexal mass. Pulsed Doppler evaluation of both ovaries demonstrates normal low-resistance arterial and venous waveforms. Other findings No abnormal free fluid. IMPRESSION: No acute abnormality noted. Electronically Signed   By: Inez Catalina M.D.   On: 02/17/2020 22:16    Procedures Procedures   Medications Ordered in ED Medications  HYDROmorphone (DILAUDID) injection 0.5 mg (0.5 mg Intravenous Given 02/17/20 2035)    ED Course  I have reviewed the triage vital signs and the nursing notes.  Pertinent labs & imaging results that were available during my care of the patient were reviewed by me and considered in my medical decision making (see chart for details).    MDM Rules/Calculators/A&P                          Labs are sent and white count is normal a hemoglobin 9 chemistry remarkable.  Urinalysis shows only rare bacteria with 6-10 squamous cells.  Given her pain she was given Dilaudid 0.5 mg IV subsequently feeling much better.  Ultrasound shows good blood flow to bilateral ovaries and no significantly enlarged ovarian size noted.  I did consider diverticullitis, ovarian torsion, among other differentials, however clinically patient is much improved and ultrasounds were unremarkable.  At this point she states the pain is nearly completely gone.  I will advise close follow-up with her OB/GYN doctor within the week, advised immediate return for worsening pain fevers or any additional concerns.   Final Clinical Impression(s) / ED Diagnoses Final diagnoses:  Left lower quadrant abdominal pain    Rx / DC Orders ED Discharge Orders         Ordered    naproxen (NAPROSYN) 500 MG tablet  2 times daily with meals        02/17/20 2229           Luna Fuse, MD 02/17/20 2229

## 2020-02-19 ENCOUNTER — Other Ambulatory Visit: Payer: Self-pay

## 2020-02-19 ENCOUNTER — Encounter (HOSPITAL_COMMUNITY): Payer: Self-pay

## 2020-02-19 DIAGNOSIS — F17210 Nicotine dependence, cigarettes, uncomplicated: Secondary | ICD-10-CM | POA: Diagnosis not present

## 2020-02-19 DIAGNOSIS — J45909 Unspecified asthma, uncomplicated: Secondary | ICD-10-CM | POA: Diagnosis not present

## 2020-02-19 DIAGNOSIS — A599 Trichomoniasis, unspecified: Secondary | ICD-10-CM | POA: Insufficient documentation

## 2020-02-19 DIAGNOSIS — N7011 Chronic salpingitis: Secondary | ICD-10-CM | POA: Diagnosis not present

## 2020-02-19 DIAGNOSIS — R1032 Left lower quadrant pain: Secondary | ICD-10-CM | POA: Diagnosis present

## 2020-02-19 DIAGNOSIS — Z91010 Allergy to peanuts: Secondary | ICD-10-CM | POA: Diagnosis not present

## 2020-02-19 DIAGNOSIS — Z9851 Tubal ligation status: Secondary | ICD-10-CM | POA: Diagnosis not present

## 2020-02-19 DIAGNOSIS — N739 Female pelvic inflammatory disease, unspecified: Secondary | ICD-10-CM | POA: Diagnosis not present

## 2020-02-19 LAB — CBC
HCT: 29.1 % — ABNORMAL LOW (ref 36.0–46.0)
Hemoglobin: 9.2 g/dL — ABNORMAL LOW (ref 12.0–15.0)
MCH: 26.7 pg (ref 26.0–34.0)
MCHC: 31.6 g/dL (ref 30.0–36.0)
MCV: 84.6 fL (ref 80.0–100.0)
Platelets: 438 10*3/uL — ABNORMAL HIGH (ref 150–400)
RBC: 3.44 MIL/uL — ABNORMAL LOW (ref 3.87–5.11)
RDW: 18.6 % — ABNORMAL HIGH (ref 11.5–15.5)
WBC: 10.6 10*3/uL — ABNORMAL HIGH (ref 4.0–10.5)
nRBC: 0 % (ref 0.0–0.2)

## 2020-02-19 LAB — COMPREHENSIVE METABOLIC PANEL
ALT: 8 U/L (ref 0–44)
AST: 20 U/L (ref 15–41)
Albumin: 3.7 g/dL (ref 3.5–5.0)
Alkaline Phosphatase: 48 U/L (ref 38–126)
Anion gap: 13 (ref 5–15)
BUN: 6 mg/dL (ref 6–20)
CO2: 20 mmol/L — ABNORMAL LOW (ref 22–32)
Calcium: 8.8 mg/dL — ABNORMAL LOW (ref 8.9–10.3)
Chloride: 102 mmol/L (ref 98–111)
Creatinine, Ser: 0.82 mg/dL (ref 0.44–1.00)
GFR, Estimated: >60 mL/min (ref >60)
Glucose, Bld: 125 mg/dL — ABNORMAL HIGH (ref 70–99)
Potassium: 4 mmol/L (ref 3.5–5.1)
Sodium: 135 mmol/L (ref 135–145)
Total Bilirubin: 0.9 mg/dL (ref 0.3–1.2)
Total Protein: 7.9 g/dL (ref 6.5–8.1)

## 2020-02-19 LAB — I-STAT BETA HCG BLOOD, ED (MC, WL, AP ONLY): I-stat hCG, quantitative: 5.7 m[IU]/mL — ABNORMAL HIGH (ref <5)

## 2020-02-19 LAB — LIPASE, BLOOD: Lipase: 24 U/L (ref 11–51)

## 2020-02-19 NOTE — ED Triage Notes (Signed)
Patient arrived via [facility] with complaints of LLQ abdominal pain. Compalints of constipation. Given 50 mcg Fentanyl en route.

## 2020-02-20 ENCOUNTER — Emergency Department (HOSPITAL_COMMUNITY)
Admission: EM | Admit: 2020-02-20 | Discharge: 2020-02-20 | Disposition: A | Discharge status: Home / Self Care | Payer: PRIVATE HEALTH INSURANCE | Attending: Emergency Medicine | Admitting: Emergency Medicine

## 2020-02-20 ENCOUNTER — Emergency Department (HOSPITAL_COMMUNITY): Payer: PRIVATE HEALTH INSURANCE

## 2020-02-20 DIAGNOSIS — A599 Trichomoniasis, unspecified: Secondary | ICD-10-CM

## 2020-02-20 DIAGNOSIS — N730 Acute parametritis and pelvic cellulitis: Secondary | ICD-10-CM

## 2020-02-20 DIAGNOSIS — N7011 Chronic salpingitis: Secondary | ICD-10-CM

## 2020-02-20 LAB — WET PREP, GENITAL
Clue Cells Wet Prep HPF POC: NONE SEEN
Sperm: NONE SEEN
Yeast Wet Prep HPF POC: NONE SEEN

## 2020-02-20 LAB — URINALYSIS, ROUTINE W REFLEX MICROSCOPIC
Bilirubin Urine: NEGATIVE
Glucose, UA: NEGATIVE mg/dL
Hgb urine dipstick: NEGATIVE
Ketones, ur: 20 mg/dL — AB
Nitrite: NEGATIVE
Protein, ur: 30 mg/dL — AB
Specific Gravity, Urine: 1.029 (ref 1.005–1.030)
WBC, UA: >50 WBC/hpf — ABNORMAL HIGH (ref 0–5)
pH: 5 (ref 5.0–8.0)

## 2020-02-20 MED ORDER — NAPROXEN 500 MG PO TABS: 500.0000 mg | ORAL_TABLET | Freq: Two times a day (BID) | ORAL | 0 refills | Status: DC

## 2020-02-20 MED ORDER — IOHEXOL 300 MG/ML  SOLN
100.0000 mL | Freq: Once | INTRAMUSCULAR | Status: AC | PRN
  Administered 2020-02-20: 100 mL via INTRAVENOUS

## 2020-02-20 MED ORDER — MORPHINE SULFATE (PF) 4 MG/ML IV SOLN
4.0000 mg | Freq: Once | INTRAVENOUS | Status: AC
  Administered 2020-02-20: 4 mg via INTRAVENOUS
  Filled 2020-02-20: qty 1

## 2020-02-20 MED ORDER — ONDANSETRON 4 MG PO TBDP: 4.0000 mg | ORAL_TABLET | Freq: Three times a day (TID) | ORAL | 0 refills | Status: DC | PRN

## 2020-02-20 MED ORDER — ONDANSETRON HCL 4 MG/2ML IJ SOLN
4.0000 mg | Freq: Once | INTRAMUSCULAR | Status: AC
  Administered 2020-02-20: 4 mg via INTRAVENOUS
  Filled 2020-02-20: qty 2

## 2020-02-20 MED ORDER — DOXYCYCLINE HYCLATE 100 MG PO CAPS: 100.0000 mg | ORAL_CAPSULE | Freq: Two times a day (BID) | ORAL | 0 refills | Status: DC

## 2020-02-20 MED ORDER — DOXYCYCLINE HYCLATE 100 MG PO TABS
100.0000 mg | ORAL_TABLET | Freq: Once | ORAL | Status: AC
  Administered 2020-02-20: 100 mg via ORAL
  Filled 2020-02-20: qty 1

## 2020-02-20 MED ORDER — METRONIDAZOLE 500 MG PO TABS
2000.0000 mg | ORAL_TABLET | Freq: Once | ORAL | Status: AC
  Administered 2020-02-20: 2000 mg via ORAL
  Filled 2020-02-20: qty 4

## 2020-02-20 MED ORDER — CEFTRIAXONE SODIUM 1 G IJ SOLR
500.0000 mg | Freq: Once | INTRAMUSCULAR | Status: AC
  Administered 2020-02-20: 500 mg via INTRAMUSCULAR
  Filled 2020-02-20: qty 10

## 2020-02-20 NOTE — ED Provider Notes (Signed)
[facility] DEPT Provider Note   CSN: [id number] Arrival date & time: 02/19/20  2211     History Chief Complaint  Patient presents with  . Abdominal Pain    Carolyn Bryant is a 40 y.o. female.  HPI     This is a 40 year old female with a history of asthma who presents with persistent left lower quadrant pain.  Patient was seen and evaluated Saturday for the same.  At that time she had a reassuring work-up including ultrasound to rule out ovarian torsion.  Patient reports she has had persistent left lower quadrant pain.  At times her pain is so bad that she has difficulty walking.  She states that the pain makes it difficult for her to walk on her left leg.  Pain is worse when she stands up or straightens out her abdomen.  She reports nausea without vomiting or diarrhea.  No urinary symptoms.  She just finished her menstrual cycle.  She is not had any dysuria or hematuria.  Denies vaginal discharge or concerns for STD.  Currently her pain is 4 out of 10.  She denies fevers.  Past Medical History:  Diagnosis Date  . Asthma     There are no problems to display for this patient.   Past Surgical History:  Procedure Laterality Date  . TUBAL LIGATION       OB History    Gravida  3   Para  3   Term  3   Preterm      AB  0   Living  2     SAB  0   IAB      Ectopic      Multiple      Live Births              No family history on file.  Social History   Tobacco Use  . Smoking status: Current Every Day Smoker    Packs/day: 0.10    Types: Cigarettes  . Smokeless tobacco: Never Used  Vaping Use  . Vaping Use: Never used  Substance Use Topics  . Alcohol use: Yes    Comment: occasional  . Drug use: No    Home Medications Prior to Admission medications   Medication Sig Start Date End Date Taking? Authorizing Provider  doxycycline (VIBRAMYCIN) 100 MG capsule Take 1 capsule (100 mg total) by mouth 2 (two) times daily.  02/20/20  Yes Kaylib Furness, Barbette Hair, MD  naproxen (NAPROSYN) 500 MG tablet Take 1 tablet (500 mg total) by mouth 2 (two) times daily. 02/20/20  Yes Amarilis Belflower, Barbette Hair, MD  ondansetron (ZOFRAN ODT) 4 MG disintegrating tablet Take 1 tablet (4 mg total) by mouth every 8 (eight) hours as needed for nausea or vomiting. 02/20/20  Yes Dracen Reigle, Barbette Hair, MD  naproxen (NAPROSYN) 500 MG tablet Take 1 tablet (500 mg total) by mouth 2 (two) times daily with a meal for 6 days. 02/17/20 02/23/20  Luna Fuse, MD    Allergies    Peanuts [peanut oil] and Prednisone  Review of Systems   Review of Systems  Constitutional: Negative for fever.  Respiratory: Negative for shortness of breath.   Cardiovascular: Negative for chest pain.  Gastrointestinal: Positive for abdominal pain. Negative for diarrhea, nausea and vomiting.  Genitourinary: Negative for dysuria.  All other systems reviewed and are negative.   Physical Exam Updated Vital Signs BP (!) 135/99   Pulse 71   Temp 99 F (37.2 C) (  Oral)   Resp 16   SpO2 100%   Physical Exam Vitals and nursing note reviewed.  Constitutional:      Appearance: She is well-developed and well-nourished.  HENT:     Head: Normocephalic and atraumatic.  Eyes:     Pupils: Pupils are equal, round, and reactive to light.  Cardiovascular:     Rate and Rhythm: Normal rate and regular rhythm.     Heart sounds: Normal heart sounds.  Pulmonary:     Effort: Pulmonary effort is normal. No respiratory distress.     Breath sounds: No wheezing.  Abdominal:     General: Bowel sounds are normal.     Palpations: Abdomen is soft.     Tenderness: There is abdominal tenderness in the left lower quadrant. There is no guarding or rebound. Positive signs include psoas sign.  Genitourinary:    Vagina: Normal.     Cervix: Discharge present.     Adnexa:        Left: Tenderness present.   Musculoskeletal:     Cervical back: Neck supple.  Skin:    General: Skin is warm and dry.   Neurological:     Mental Status: She is alert and oriented to person, place, and time.  Psychiatric:        Mood and Affect: Mood and affect and mood normal.     ED Results / Procedures / Treatments   Labs (all labs ordered are listed, but only abnormal results are displayed) Labs Reviewed  WET PREP, GENITAL - Abnormal; Notable for the following components:      Result Value   Trich, Wet Prep PRESENT (*)    WBC, Wet Prep HPF POC MANY (*)    All other components within normal limits  COMPREHENSIVE METABOLIC PANEL - Abnormal; Notable for the following components:   CO2 20 (*)    Glucose, Bld 125 (*)    Calcium 8.8 (*)    All other components within normal limits  CBC - Abnormal; Notable for the following components:   WBC 10.6 (*)    RBC 3.44 (*)    Hemoglobin 9.2 (*)    HCT 29.1 (*)    RDW 18.6 (*)    Platelets 438 (*)    All other components within normal limits  URINALYSIS, ROUTINE W REFLEX MICROSCOPIC - Abnormal; Notable for the following components:   Color, Urine AMBER (*)    APPearance CLOUDY (*)    Ketones, ur 20 (*)    Protein, ur 30 (*)    Leukocytes,Ua LARGE (*)    WBC, UA >50 (*)    Bacteria, UA FEW (*)    All other components within normal limits  I-STAT BETA HCG BLOOD, ED ([facility], [facility], [facility] ONLY) - Abnormal; Notable for the following components:   I-stat hCG, quantitative 5.7 (*)    All other components within normal limits  URINE CULTURE  LIPASE, BLOOD  GC/CHLAMYDIA PROBE AMP () NOT AT [facility]    EKG None  Radiology CT ABDOMEN PELVIS W CONTRAST  Result Date: 02/20/2020 CLINICAL DATA:  Left lower quadrant pain with constipation EXAM: CT ABDOMEN AND PELVIS WITH CONTRAST TECHNIQUE: Multidetector CT imaging of the abdomen and pelvis was performed using the standard protocol following bolus administration of intravenous contrast. CONTRAST:  11mL OMNIPAQUE IOHEXOL 300 MG/ML  SOLN COMPARISON:  08/21/2014 FINDINGS: Lower chest:  No contributory findings.  Hepatobiliary: 9 mm hypervascular mass in the central liver, present in 2016 although more subtle at that  time due to differences in technique, most consistent with flash fill hemangioma.2 calcified gallstones. Negative for cholecystitis or bile duct dilatation. 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 Pancreas: Unremarkable. Spleen: Unremarkable. Adrenals/Urinary Tract: Negative adrenals. No hydronephrosis or stone. Unremarkable bladder. Stomach/Bowel:  No obstruction. No appendicitis. Vascular/Lymphatic: No acute vascular abnormality. Mild low-density atheromatous plaque of the aorta. No mass or adenopathy. Reproductive:Large left gonadal vein, also present in 2016. Dilated left fallopian tube measuring up to 1 cm in diameter distally. The right tube is also dilated at up to 7 mm. There is history of tubal ligation. No visible adjacent fat inflammation or abscess. Nabothian cysts. Other: No ascites or pneumoperitoneum. Musculoskeletal: No acute abnormalities. IMPRESSION: 1. Left larger than right hydrosalpinx. No visible pelvic fat inflammation or abscess. 2. Cholelithiasis without cholecystitis. 3. 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 4. Negative for appendicitis. Electronically Signed   By: Monte Fantasia M.D.   On: 02/20/2020 06:05    Procedures Procedures   Medications Ordered in ED Medications  metroNIDAZOLE (FLAGYL) tablet 2,000 mg (has no administration in time range)  morphine 4 MG/ML injection 4 mg (4 mg Intravenous Given 02/20/20 0416)  ondansetron (ZOFRAN) injection 4 mg (4 mg Intravenous Given 02/20/20 0415)  iohexol (OMNIPAQUE) 300 MG/ML solution 100 mL (100 mLs Intravenous Contrast Given 02/20/20 0541)  cefTRIAXone (ROCEPHIN) injection 500 mg (500 mg Intramuscular Given 02/20/20 0642)  doxycycline (VIBRA-TABS) tablet 100 mg (100 mg Oral Given 02/20/20 2353)    ED Course  I have reviewed the triage vital signs and the nursing notes.  Pertinent labs & imaging results that were available during my care  of the patient were reviewed by me and considered in my medical decision making (see chart for details).    MDM Rules/Calculators/A&P                          Patient presents with ongoing left lower quadrant pain. She is overall nontoxic and vital signs are reassuring. She is afebrile. She had a ultrasound yesterday that ruled out ovarian torsion and pathology. Other considerations include but not limited to diverticulitis, PID, colitis, kidney stone. CT scan ordered. Lab work reviewed from triage. Slight leukocytosis. Urinalysis today with large leukocyte esterase and greater than 50 white cells. It does appear dirty. Culture was sent. She is not currently having any urinary symptoms. CT scan is concerning for left-sided greater than right-sided hydrosalpinx. There is no abscess noted. Pelvic exam with copious discharge and her wet prep is positive for trichomonas. She will be treated for PID. I discussed with her safe sex practices and abstinence for the next 14 days. Naproxen for pain and Zofran for nausea. Since tolerating fluids  After history, exam, and medical workup I feel the patient has been appropriately medically screened and is safe for discharge home. Pertinent diagnoses were discussed with the patient. Patient was given return precautions.  Final Clinical Impression(s) / ED Diagnoses Final diagnoses:  PID (acute pelvic inflammatory disease)  Hydrosalpinx  Trichimoniasis    Rx / DC Orders ED Discharge Orders         Ordered    doxycycline (VIBRAMYCIN) 100 MG capsule  2 times daily        02/20/20 0718    naproxen (NAPROSYN) 500 MG tablet  2 times daily        02/20/20 0718    ondansetron (ZOFRAN ODT) 4 MG disintegrating tablet  Every 8 hours PRN  02/20/20 [id number]           Merryl Hacker, MD 02/20/20 [phone]

## 2020-02-20 NOTE — ED Notes (Signed)
Pt transferred to CT.

## 2020-02-20 NOTE — Discharge Instructions (Addendum)
You are seen today for abdominal pain. Your wet prep was concerning for trichomonas which is an STD. Given your CT changes, you will be treated for infections and pelvic inflammatory disease. Finish antibiotics. Take medications as prescribed. If you develop fevers or worsening symptoms, you should be reevaluated. Abstain from sexual activity for the next 14 days.

## 2020-02-20 NOTE — ED Notes (Signed)
Lab to add on urine culture to grey tube in lab.

## 2020-02-20 NOTE — ED Notes (Signed)
Patient here for same complaints of menstrual cramping as when she was seen 1/29

## 2020-02-21 LAB — GC/CHLAMYDIA PROBE AMP (~~LOC~~) NOT AT ARMC
Chlamydia: NEGATIVE
Comment: NEGATIVE
Comment: NORMAL
Neisseria Gonorrhea: POSITIVE — AB

## 2020-02-21 LAB — URINE CULTURE

## 2020-08-28 ENCOUNTER — Other Ambulatory Visit: Payer: Self-pay

## 2020-08-28 ENCOUNTER — Ambulatory Visit
Admission: RE | Admit: 2020-08-28 | Discharge: 2020-08-28 | Disposition: A | Discharge status: Home / Self Care | Payer: Self-pay | Source: Ambulatory Visit | Attending: Urgent Care | Admitting: Urgent Care

## 2020-08-28 ENCOUNTER — Ambulatory Visit (INDEPENDENT_AMBULATORY_CARE_PROVIDER_SITE_OTHER): Payer: Self-pay

## 2020-08-28 VITALS — BP 138/80 | HR 74 | Temp 98.3°F | Resp 18

## 2020-08-28 DIAGNOSIS — M79672 Pain in left foot: Secondary | ICD-10-CM

## 2020-08-28 DIAGNOSIS — S9032XA Contusion of left foot, initial encounter: Secondary | ICD-10-CM

## 2020-08-28 DIAGNOSIS — M79675 Pain in left toe(s): Secondary | ICD-10-CM

## 2020-08-28 MED ORDER — NAPROXEN 500 MG PO TABS: 500.0000 mg | ORAL_TABLET | Freq: Two times a day (BID) | ORAL | 0 refills | Status: DC

## 2020-08-28 MED ORDER — IBUPROFEN 800 MG PO TABS
800.0000 mg | ORAL_TABLET | Freq: Once | ORAL | Status: AC
  Administered 2020-08-28: 800 mg via ORAL

## 2020-08-28 NOTE — ED Triage Notes (Signed)
Pt c/o left pinky toe pain onset yesterday when rolled over by shopping cart at [employer]. Pain hurts most when weight bearing described as a dull achy constant pain. Pt can ambulate and observed tow moving minimally compared to other toes. Observed redness and swelling today on assessment. Has tried heat pack and tylenol without relief.

## 2020-08-28 NOTE — ED Provider Notes (Signed)
[facility]   MRN: [id number] DOB: [birth year]  Subjective:   Carolyn Bryant is a 40 y.o. female presenting for 1 day history of acute onset persistent and worsening left pinky toe pain with swelling.  Symptoms started after the patient accidentally pulled a cart over her foot.  Initially she felt like she could carry on with the pain, went to work and did an 8 to 10-hour shift.  However her pain has worsened since then.  She is having more swelling, bruising and pain that is now radiating into the lateral aspect of her left foot.  She did use Tylenol last night with minimal relief.  Denies taking chronic medications.    Allergies  Allergen Reactions   Peanuts [Peanut Oil] Hives and Swelling    Reaction: throat swelling   Prednisone Rash    Past Medical History:  Diagnosis Date   Asthma      Past Surgical History:  Procedure Laterality Date   TUBAL LIGATION      History reviewed. No pertinent family history.  Social History   Tobacco Use   Smoking status: Every Day    Packs/day: 0.10    Types: Cigarettes   Smokeless tobacco: Never  Vaping Use   Vaping Use: Never used  Substance Use Topics   Alcohol use: Yes    Comment: occasional   Drug use: No    ROS   Objective:   Vitals: BP 138/80 (BP Location: Left Arm)   Pulse 74   Temp 98.3 F (36.8 C) (Oral)   Resp 18   LMP 08/19/2020 (Approximate)   SpO2 95%   Physical Exam Constitutional:      General: She is not in acute distress.    Appearance: Normal appearance. She is well-developed. She is not ill-appearing.  HENT:     Head: Normocephalic and atraumatic.     Nose: Nose normal.     Mouth/Throat:     Mouth: Mucous membranes are moist.     Pharynx: Oropharynx is clear.  Eyes:     General: No scleral icterus.    Extraocular Movements: Extraocular movements intact.     Pupils: Pupils are equal, round, and reactive to light.  Cardiovascular:     Rate and Rhythm: Normal rate.  Pulmonary:      Effort: Pulmonary effort is normal.  Musculoskeletal:       Feet:  Skin:    General: Skin is warm and dry.  Neurological:     General: No focal deficit present.     Mental Status: She is alert and oriented to person, place, and time.  Psychiatric:        Mood and Affect: Mood normal.        Behavior: Behavior normal.    DG Toe 5th Left  Result Date: 08/28/2020 CLINICAL DATA:  40 year old female status post blunt trauma to left 5th toe yesterday. Pain. EXAM: DG TOE 5TH LEFT COMPARISON:  Left ankle series 05/29/2004. FINDINGS: Bone mineralization is within normal limits. There is no evidence of fracture or dislocation. Fused 5th middle and distal phalanges, normal variant. There is no evidence of arthropathy or other focal bone abnormality. No discrete soft tissue injury. IMPRESSION: Negative. Electronically Signed   By: Genevie Ann M.D.   On: 08/28/2020 09:56     Assessment and Plan :   PDMP not reviewed this encounter.  1. Contusion of left foot, initial encounter   2. Toe pain, left   3. Left foot pain  Recommended conservative management for foot contusion.  We will have the use of postop shoe for the next couple of days.  Naproxen for pain and inflammation.  X-ray negative. Counseled patient on potential for adverse effects with medications prescribed/recommended today, ER and return-to-clinic precautions discussed, patient verbalized understanding.    Jaynee Eagles, PA-C 08/28/20 1012

## 2020-11-01 ENCOUNTER — Ambulatory Visit: Payer: Self-pay

## 2020-11-01 ENCOUNTER — Encounter: Payer: Self-pay | Admitting: Emergency Medicine

## 2020-11-01 ENCOUNTER — Other Ambulatory Visit: Payer: Self-pay

## 2020-11-01 ENCOUNTER — Ambulatory Visit
Admission: EM | Admit: 2020-11-01 | Discharge: 2020-11-01 | Disposition: A | Discharge status: Home / Self Care | Payer: PRIVATE HEALTH INSURANCE | Attending: Physician Assistant | Admitting: Physician Assistant

## 2020-11-01 DIAGNOSIS — L03012 Cellulitis of left finger: Secondary | ICD-10-CM

## 2020-11-01 MED ORDER — MUPIROCIN CALCIUM 2 % EX CREA: 1.0000 "application " | TOPICAL_CREAM | Freq: Two times a day (BID) | CUTANEOUS | 0 refills | Status: DC

## 2020-11-01 MED ORDER — DOXYCYCLINE HYCLATE 100 MG PO CAPS: 100.0000 mg | ORAL_CAPSULE | Freq: Two times a day (BID) | ORAL | 0 refills | Status: DC

## 2020-11-01 NOTE — ED Triage Notes (Signed)
Index and middle finger abrasions 2 days prior. Cleaned with hydrogen peroxide initially, kept it covered with bandaids and neosporin, cleaned daily. Today both fingers are swollen and tender. When putting neosporin on today it began burning and itching, had to wash off ointment.

## 2020-11-01 NOTE — ED Provider Notes (Signed)
[facility]    CSN: [id number] Arrival date & time: 11/01/20  1754      History   Chief Complaint Chief Complaint  Patient presents with   Finger Injury    HPI Carolyn Bryant is a 40 y.o. female.   Patient here today for evaluation of left finger injuries that occurred a few days ago.  She reports that she went to shut her dishwasher and the door came back quicker than she can move her fingers, and they were smashed between the door and the opening of the dishwasher.  She reports immediate pain, but this has been well controlled until today when she noticed more swelling to her fingers.  She has not had any numbness or tingling.  She has not had any fever or chills.  She denies any nausea or vomiting other than right after the incident.  She has tried keeping area covered with Neosporin, and has been cleaning wounds daily.  The history is provided by the patient.   Past Medical History:  Diagnosis Date   Asthma     There are no problems to display for this patient.   Past Surgical History:  Procedure Laterality Date   TUBAL LIGATION      OB History     Gravida  3   Para  3   Term  3   Preterm      AB  0   Living  2      SAB  0   IAB      Ectopic      Multiple      Live Births               Home Medications    Prior to Admission medications   Medication Sig Start Date End Date Taking? Authorizing Provider  doxycycline (VIBRAMYCIN) 100 MG capsule Take 1 capsule (100 mg total) by mouth 2 (two) times daily. 11/01/20  Yes Francene Finders, PA-C  mupirocin cream (BACTROBAN) 2 % Apply 1 application topically 2 (two) times daily. 11/01/20  Yes Francene Finders, PA-C  naproxen (NAPROSYN) 500 MG tablet Take 1 tablet (500 mg total) by mouth 2 (two) times daily with a meal. 08/28/20   Jaynee Eagles, PA-C    Family History History reviewed. No pertinent family history.  Social History Social History   Tobacco Use   Smoking status:  Every Day    Packs/day: 0.10    Types: Cigarettes   Smokeless tobacco: Never  Vaping Use   Vaping Use: Never used  Substance Use Topics   Alcohol use: Yes    Comment: occasional   Drug use: No     Allergies   Peanuts [peanut oil] and Prednisone   Review of Systems Review of Systems  Constitutional:  Negative for chills and fever.  Respiratory:  Negative for shortness of breath.   Gastrointestinal:  Negative for nausea and vomiting.  Musculoskeletal:  Negative for arthralgias.  Skin:  Positive for color change and wound.    Physical Exam Triage Vital Signs ED Triage Vitals [11/01/20 1808]  Enc Vitals Group     BP (!) 141/93     Pulse Rate 75     Resp 16     Temp 98 F (36.7 C)     Temp Source Oral     SpO2 99 %     Weight      Height      Head Circumference  Peak Flow      Pain Score 6     Pain Loc      Pain Edu?      Excl. in [place]?    No data found.  Updated Vital Signs BP (!) 141/93 (BP Location: Left Arm)   Pulse 75   Temp 98 F (36.7 C) (Oral)   Resp 16   SpO2 99%      Physical Exam Vitals and nursing note reviewed.  Constitutional:      General: She is not in acute distress.    Appearance: Normal appearance. She is not ill-appearing.  HENT:     Head: Normocephalic and atraumatic.  Eyes:     Conjunctiva/sclera: Conjunctivae normal.  Cardiovascular:     Rate and Rhythm: Normal rate.  Pulmonary:     Effort: Pulmonary effort is normal.  Musculoskeletal:     Comments: Decreased ROM at Left 2nd and 3rd DIP joints due to swelling to distal left 2nd and 3rd fingers  Skin:    Comments: ~1 cm superficial abrasions to 2nd and 3rd left distal fingers without active bleeding, mild swelling and erythema is present  Neurological:     Mental Status: She is alert.     Comments: Gross sensation intact to distal 2nd and 3rd fingers  Psychiatric:        Mood and Affect: Mood normal.        Behavior: Behavior normal.     UC Treatments / Results   Labs (all labs ordered are listed, but only abnormal results are displayed) Labs Reviewed - No data to display  EKG   Radiology No results found.  Procedures Procedures (including critical care time)  Medications Ordered in UC Medications - No data to display  Initial Impression / Assessment and Plan / UC Course  I have reviewed the triage vital signs and the nursing notes.  Pertinent labs & imaging results that were available during my care of the patient were reviewed by me and considered in my medical decision making (see chart for details).   Suspect likely mild cellulitis and will treat with doxycycline and mupirocin cream as there is a possibility she is also having reaction to neosporin. Encouraged follow up if symptoms fail to improve or worsen in any way.   Final Clinical Impressions(s) / UC Diagnoses   Final diagnoses:  Cellulitis of finger of left hand   Discharge Instructions   None    ED Prescriptions     Medication Sig Dispense Auth. Provider   doxycycline (VIBRAMYCIN) 100 MG capsule Take 1 capsule (100 mg total) by mouth 2 (two) times daily. 20 capsule Ewell Poe F, PA-C   mupirocin cream (BACTROBAN) 2 % Apply 1 application topically 2 (two) times daily. 15 g Francene Finders, PA-C      PDMP not reviewed this encounter.   Francene Finders, PA-C 11/01/20 1928

## 2020-11-02 ENCOUNTER — Telehealth: Payer: Self-pay | Admitting: Physician Assistant

## 2020-11-02 ENCOUNTER — Telehealth: Payer: Self-pay

## 2020-11-02 MED ORDER — MUPIROCIN 2 % EX OINT: 1.0000 "application " | TOPICAL_OINTMENT | Freq: Two times a day (BID) | CUTANEOUS | 0 refills | Status: DC

## 2020-11-02 MED ORDER — MUPIROCIN 2 % EX OINT: 1.0000 "application " | TOPICAL_OINTMENT | Freq: Two times a day (BID) | CUTANEOUS | 0 refills | Status: AC

## 2020-11-02 NOTE — Telephone Encounter (Signed)
Patient called because mupirocin cream was not covered- would like alternative sent to pharmacy. Ointment preparation prescribed.

## 2020-12-22 ENCOUNTER — Emergency Department (HOSPITAL_COMMUNITY)
Admission: EM | Admit: 2020-12-22 | Discharge: 2020-12-22 | Disposition: A | Discharge status: Home / Self Care | Payer: PRIVATE HEALTH INSURANCE | Attending: Emergency Medicine | Admitting: Emergency Medicine

## 2020-12-22 ENCOUNTER — Encounter (HOSPITAL_COMMUNITY): Payer: Self-pay

## 2020-12-22 ENCOUNTER — Other Ambulatory Visit: Payer: Self-pay

## 2020-12-22 DIAGNOSIS — F17210 Nicotine dependence, cigarettes, uncomplicated: Secondary | ICD-10-CM | POA: Diagnosis not present

## 2020-12-22 DIAGNOSIS — J45909 Unspecified asthma, uncomplicated: Secondary | ICD-10-CM | POA: Insufficient documentation

## 2020-12-22 DIAGNOSIS — R509 Fever, unspecified: Secondary | ICD-10-CM | POA: Diagnosis present

## 2020-12-22 DIAGNOSIS — U071 COVID-19: Secondary | ICD-10-CM

## 2020-12-22 LAB — RESP PANEL BY RT-PCR (FLU A&B, COVID) ARPGX2
Influenza A by PCR: NEGATIVE
Influenza B by PCR: NEGATIVE
SARS Coronavirus 2 by RT PCR: POSITIVE — AB

## 2020-12-22 MED ORDER — BENZONATATE 100 MG PO CAPS: 100.0000 mg | ORAL_CAPSULE | Freq: Three times a day (TID) | ORAL | 0 refills | Status: DC

## 2020-12-22 MED ORDER — ACETAMINOPHEN 500 MG PO TABS: 500.0000 mg | ORAL_TABLET | Freq: Four times a day (QID) | ORAL | 0 refills | Status: DC | PRN

## 2020-12-22 MED ORDER — ACETAMINOPHEN 500 MG PO TABS
1000.0000 mg | ORAL_TABLET | Freq: Once | ORAL | Status: AC
  Administered 2020-12-22: 20:00:00 1000 mg via ORAL
  Filled 2020-12-22: qty 2

## 2020-12-22 MED ORDER — BENZONATATE 100 MG PO CAPS
100.0000 mg | ORAL_CAPSULE | Freq: Once | ORAL | Status: AC
  Administered 2020-12-22: 21:00:00 100 mg via ORAL
  Filled 2020-12-22: qty 1

## 2020-12-22 MED ORDER — NIRMATRELVIR/RITONAVIR (PAXLOVID)TABLET: 3.0000 | ORAL_TABLET | Freq: Two times a day (BID) | ORAL | 0 refills | Status: AC

## 2020-12-22 NOTE — ED Provider Notes (Signed)
Emergency Medicine Provider Triage Evaluation Note  Carolyn Bryant , a 40 y.o. female  was evaluated in triage.  Pt complains of feeling unwell.  Has myalgias, cough, congestion, headache, scratchy throat.  No known sick contacts.  Has had low-grade temperature.  Review of Systems  Positive: Cough, Congestion, headache Negative: Abdominal pain, chest pain, lower extremity swelling  Physical Exam  BP (!) 133/111 (BP Location: Right Arm)   Pulse (!) 114   Temp 99.8 F (37.7 C)   Resp 20   SpO2 100%  Gen:   Awake, no distress   Resp:  Normal effort  MSK:   Moves extremities without difficulty  Other:    Medical Decision Making  Medically screening exam initiated at 7:28 PM.  Appropriate orders placed.  DAYANE HILLENBURG was informed that the remainder of the evaluation will be completed by another provider, this initial triage assessment does not replace that evaluation, and the importance of remaining in the ED until their evaluation is complete.  UR sx   Derrien Anschutz A, PA-C 12/22/20 Orland Jarred, MD 12/23/20 [phone]

## 2020-12-22 NOTE — ED Triage Notes (Signed)
Pt reports with fever, chills, cough, and generalized body aches since today.

## 2020-12-22 NOTE — Discharge Instructions (Addendum)
Recommendations for at home COVID-19 symptoms management:  Please continue isolation at home. Call [phone] to see whether you might be eligible for therapeutic antibody infusions (leave your name and they will call you back).  If have acute worsening of symptoms please go to ER/urgent care for further evaluation. Check pulse oximetry and if below 90-92% please go to ER. The following supplements MAY help:  Vitamin C 500mg  twice a day and Quercetin 250-500 mg twice a day Vitamin D3 2000 - 4000 u/day B Complex vitamins Zinc 75-100 mg/day Melatonin 6-10 mg at night (the optimal dose is unknown) Aspirin 81mg /day (if no history of bleeding issues)

## 2020-12-22 NOTE — ED Provider Notes (Signed)
[facility] Provider Note   CSN: [id number] Arrival date & time: 12/22/20  1915     History Chief Complaint  Patient presents with   Chills   Fever   Generalized Body Aches   Cough    Carolyn Bryant is a 40 y.o. female.  The history is provided by the patient. No language interpreter was used.  Fever Associated symptoms: cough   Cough Associated symptoms: fever    40 year old female significant history of asthma who presents with cold symptoms.  Throughout the day today patient has had fever chills body ache headache cough congestion and overall not feeling well.  Symptoms moderate to severe according to the patient.  No specific treatment tried.  No recent sick contact.  No nausea vomiting diarrhea or dysuria.  She denies any shortness of breath.  Past Medical History:  Diagnosis Date   Asthma     There are no problems to display for this patient.   Past Surgical History:  Procedure Laterality Date   TUBAL LIGATION       OB History     Gravida  3   Para  3   Term  3   Preterm      AB  0   Living  2      SAB  0   IAB      Ectopic      Multiple      Live Births              History reviewed. No pertinent family history.  Social History   Tobacco Use   Smoking status: Every Day    Packs/day: 0.10    Types: Cigarettes   Smokeless tobacco: Never  Vaping Use   Vaping Use: Never used  Substance Use Topics   Alcohol use: Yes    Comment: occasional   Drug use: No    Home Medications Prior to Admission medications   Medication Sig Start Date End Date Taking? Authorizing Provider  doxycycline (VIBRAMYCIN) 100 MG capsule Take 1 capsule (100 mg total) by mouth 2 (two) times daily. 11/01/20   Francene Finders, PA-C  naproxen (NAPROSYN) 500 MG tablet Take 1 tablet (500 mg total) by mouth 2 (two) times daily with a meal. 08/28/20   Jaynee Eagles, PA-C    Allergies    Peanuts [peanut oil] and  Prednisone  Review of Systems   Review of Systems  Constitutional:  Positive for fever.  Respiratory:  Positive for cough.   All other systems reviewed and are negative.  Physical Exam Updated Vital Signs BP (!) 134/97 (BP Location: Right Arm)   Pulse 90   Temp (!) 100.7 F (38.2 C) (Oral)   Resp 18   SpO2 99%   Physical Exam Vitals and nursing note reviewed.  Constitutional:      General: She is not in acute distress.    Appearance: She is well-developed.  HENT:     Head: Atraumatic.  Eyes:     Conjunctiva/sclera: Conjunctivae normal.  Cardiovascular:     Rate and Rhythm: Normal rate and regular rhythm.     Pulses: Normal pulses.     Heart sounds: Normal heart sounds.  Pulmonary:     Effort: Pulmonary effort is normal.     Breath sounds: No wheezing, rhonchi or rales.  Abdominal:     Palpations: Abdomen is soft.     Tenderness: There is no abdominal tenderness.  Musculoskeletal:  General: Tenderness (diffused tendernss throughout body on gentle palpation) present.     Cervical back: Neck supple.  Skin:    Findings: No rash.  Neurological:     Mental Status: She is alert.  Psychiatric:        Mood and Affect: Mood normal.    ED Results / Procedures / Treatments   Labs (all labs ordered are listed, but only abnormal results are displayed) Labs Reviewed  RESP PANEL BY RT-PCR (FLU A&B, COVID) ARPGX2 - Abnormal; Notable for the following components:      Result Value   SARS Coronavirus 2 by RT PCR POSITIVE (*)    All other components within normal limits    EKG None  Radiology No results found.  Procedures Procedures   Medications Ordered in ED Medications  acetaminophen (TYLENOL) tablet 1,000 mg (has no administration in time range)    ED Course  I have reviewed the triage vital signs and the nursing notes.  Pertinent labs & imaging results that were available during my care of the patient were reviewed by me and considered in my medical  decision making (see chart for details).    MDM Rules/Calculators/A&P                           BP (!) 134/97 (BP Location: Right Arm)   Pulse 90   Temp 99.1 F (37.3 C) (Oral)   Resp 18   SpO2 99%   Final Clinical Impression(s) / ED Diagnoses Final diagnoses:  COVID-19 virus infection    Rx / DC Orders ED Discharge Orders          Ordered    nirmatrelvir/ritonavir EUA (PAXLOVID) 20 x 150 MG & 10 x 100MG  TABS  2 times daily        12/22/20 2054    benzonatate (TESSALON) 100 MG capsule  Every 8 hours        12/22/20 2054    acetaminophen (TYLENOL) 500 MG tablet  Every 6 hours PRN        12/22/20 2054           8:28 PM Patient here with cold symptoms also started today.  She does have a temperature of 100.7.  History of asthma however no wheezing on exam.  No hypoxia.  Will provide symptom control.  8:48 PM Pt tested positive for COvid-19 infection.  Will provide Paxlovid due to her hx of asthma.  Pt has normal renal function.   Carolyn Bryant was evaluated in Emergency Department on 12/22/2020 for the symptoms described in the history of present illness. She was evaluated in the context of the global COVID-19 pandemic, which necessitated consideration that the patient might be at risk for infection with the SARS-CoV-2 virus that causes COVID-19. Institutional protocols and algorithms that pertain to the evaluation of patients at risk for COVID-19 are in a state of rapid change based on information released by regulatory bodies including the CDC and federal and state organizations. These policies and algorithms were followed during the patient's care in the ED.    Domenic Moras, PA-C 12/22/20 2055    Valarie Merino, MD 12/22/20 2256

## 2021-02-16 ENCOUNTER — Ambulatory Visit: Payer: Self-pay

## 2021-02-18 ENCOUNTER — Ambulatory Visit: Payer: Self-pay

## 2021-02-19 ENCOUNTER — Other Ambulatory Visit: Payer: Self-pay

## 2021-02-19 ENCOUNTER — Ambulatory Visit
Admission: RE | Admit: 2021-02-19 | Discharge: 2021-02-19 | Disposition: A | Discharge status: Home / Self Care | Payer: PRIVATE HEALTH INSURANCE | Source: Ambulatory Visit | Attending: Internal Medicine | Admitting: Internal Medicine

## 2021-02-19 VITALS — BP 120/68 | HR 77 | Temp 98.0°F | Resp 18

## 2021-02-19 DIAGNOSIS — K0889 Other specified disorders of teeth and supporting structures: Secondary | ICD-10-CM

## 2021-02-19 DIAGNOSIS — K047 Periapical abscess without sinus: Secondary | ICD-10-CM

## 2021-02-19 MED ORDER — AMOXICILLIN-POT CLAVULANATE 875-125 MG PO TABS: 1.0000 | ORAL_TABLET | Freq: Two times a day (BID) | ORAL | 0 refills | Status: DC

## 2021-02-19 MED ORDER — LIDOCAINE VISCOUS HCL 2 % MT SOLN: 15.0000 mL | OROMUCOSAL | 0 refills | Status: DC | PRN

## 2021-02-19 NOTE — ED Provider Notes (Signed)
[name]    CSN: [id number] Arrival date & time: 02/19/21  5465      History   Chief Complaint Chief Complaint  Patient presents with   11a appt   Oral Swelling    right    HPI Carolyn Bryant is a 41 y.o. female.   Patient presents with right-sided lower and upper dental pain that has been present for approximately 2 weeks.  She reports that she developed right-sided facial swelling approximately 3 days ago.  Denies fevers, body aches, chills, purulent drainage from mouth.  Has been taking Orajel, Tylenol, ibuprofen, salt water gargles with minimal improvement in pain and symptoms.    Past Medical History:  Diagnosis Date   Asthma     There are no problems to display for this patient.   Past Surgical History:  Procedure Laterality Date   TUBAL LIGATION      OB History     Gravida  3   Para  3   Term  3   Preterm      AB  0   Living  2      SAB  0   IAB      Ectopic      Multiple      Live Births               Home Medications    Prior to Admission medications   Medication Sig Start Date End Date Taking? Authorizing Provider  amoxicillin-clavulanate (AUGMENTIN) 875-125 MG tablet Take 1 tablet by mouth every 12 (twelve) hours. 02/19/21  Yes Maycee Blasco, Hildred Alamin E, FNP  lidocaine (XYLOCAINE) 2 % solution Use as directed 15 mLs in the mouth or throat as needed for mouth pain. Swish and spit 02/19/21  Yes Diyari Cherne, [place] E, [place]  acetaminophen (TYLENOL) 500 MG tablet Take 1 tablet (500 mg total) by mouth every 6 (six) hours as needed. 12/22/20   Domenic Moras, PA-C  benzonatate (TESSALON) 100 MG capsule Take 1 capsule (100 mg total) by mouth every 8 (eight) hours. 12/22/20   Domenic Moras, PA-C  naproxen (NAPROSYN) 500 MG tablet Take 1 tablet (500 mg total) by mouth 2 (two) times daily with a meal. 08/28/20   Jaynee Eagles, PA-C    Family History History reviewed. No pertinent family history.  Social History Social History   Tobacco Use    Smoking status: Every Day    Packs/day: 0.10    Types: Cigarettes   Smokeless tobacco: Never  Vaping Use   Vaping Use: Never used  Substance Use Topics   Alcohol use: Yes    Comment: occasional   Drug use: No     Allergies   Peanuts [peanut oil] and Prednisone   Review of Systems Review of Systems Per HPI  Physical Exam Triage Vital Signs ED Triage Vitals  Enc Vitals Group     BP 02/19/21 1059 120/68     Pulse Rate 02/19/21 1059 77     Resp 02/19/21 1059 18     Temp 02/19/21 1059 98 F (36.7 C)     Temp Source 02/19/21 1059 Oral     SpO2 02/19/21 1059 99 %     Weight --      Height --      Head Circumference --      Peak Flow --      Pain Score 02/19/21 1102 7     Pain Loc --      Pain Edu? --  Excl. in [place]? --    No data found.  Updated Vital Signs BP 120/68 (BP Location: Left Arm)    Pulse 77    Temp 98 F (36.7 C) (Oral)    Resp 18    SpO2 99%   Visual Acuity Right Eye Distance:   Left Eye Distance:   Bilateral Distance:    Right Eye Near:   Left Eye Near:    Bilateral Near:     Physical Exam Constitutional:      General: She is not in acute distress.    Appearance: Normal appearance. She is not toxic-appearing or diaphoretic.  HENT:     Head: Normocephalic and atraumatic.     Mouth/Throat:     Lips: Pink.     Mouth: Mucous membranes are moist.     Dentition: Abnormal dentition. Dental tenderness and gingival swelling present.     Comments: There is gingival swelling with mild erythema noted to right upper gingiva and right lower gingiva.  No obvious dental abscess noted.  There is mild swelling located to outside of face as well. Eyes:     Extraocular Movements: Extraocular movements intact.     Conjunctiva/sclera: Conjunctivae normal.  Pulmonary:     Effort: Pulmonary effort is normal.  Neurological:     General: No focal deficit present.     Mental Status: She is alert and oriented to person, place, and time. Mental status is at  baseline.  Psychiatric:        Mood and Affect: Mood normal.        Behavior: Behavior normal.        Thought Content: Thought content normal.        Judgment: Judgment normal.     UC Treatments / Results  Labs (all labs ordered are listed, but only abnormal results are displayed) Labs Reviewed - No data to display  EKG   Radiology No results found.  Procedures Procedures (including critical care time)  Medications Ordered in UC Medications - No data to display  Initial Impression / Assessment and Plan / UC Course  I have reviewed the triage vital signs and the nursing notes.  Pertinent labs & imaging results that were available during my care of the patient were reviewed by me and considered in my medical decision making (see chart for details).     Will treat dental infection with Augmentin antibiotic.  Viscous lidocaine also prescribed to help alleviate patient's pain.  Patient to follow-up with dentist for further evaluation and management.  Patient verbalized understanding and was agreeable with plan. Final Clinical Impressions(s) / UC Diagnoses   Final diagnoses:  Dental infection  Pain, dental     Discharge Instructions      You have a dental infection which is being treated with antibiotics.  You have also been prescribed a lidocaine solution that you can swish and spit to help alleviate pain.  Please follow-up with dentist for further evaluation and management.    ED Prescriptions     Medication Sig Dispense Auth. Provider   amoxicillin-clavulanate (AUGMENTIN) 875-125 MG tablet Take 1 tablet by mouth every 12 (twelve) hours. 14 tablet [place], Zachary E, [place]   lidocaine (XYLOCAINE) 2 % solution Use as directed 15 mLs in the mouth or throat as needed for mouth pain. Swish and spit 100 mL Teodora Medici, [place]      PDMP not reviewed this encounter.   Teodora Medici, [place] 02/19/21 1115

## 2021-02-19 NOTE — ED Triage Notes (Signed)
2 week h/o right sided bottom mouth pain that has increased since the onset and 3 day h/o right sided facial swelling. Notes pain w/chewing and laying on the right side. Has been taking ora-gel, tylenol, ibuprofen and doing salt water gargle without relief.

## 2021-02-19 NOTE — Discharge Instructions (Signed)
You have a dental infection which is being treated with antibiotics.  You have also been prescribed a lidocaine solution that you can swish and spit to help alleviate pain.  Please follow-up with dentist for further evaluation and management.

## 2021-02-26 ENCOUNTER — Ambulatory Visit: Payer: Self-pay

## 2021-04-12 ENCOUNTER — Ambulatory Visit: Payer: Self-pay

## 2021-05-29 ENCOUNTER — Ambulatory Visit
Admission: RE | Admit: 2021-05-29 | Discharge: 2021-05-29 | Disposition: A | Discharge status: Home / Self Care | Payer: PRIVATE HEALTH INSURANCE | Source: Ambulatory Visit | Attending: Student | Admitting: Student

## 2021-05-29 VITALS — BP 111/75 | HR 74 | Temp 97.9°F | Resp 18

## 2021-05-29 DIAGNOSIS — K047 Periapical abscess without sinus: Secondary | ICD-10-CM | POA: Diagnosis not present

## 2021-05-29 MED ORDER — AMOXICILLIN-POT CLAVULANATE 875-125 MG PO TABS: 1.0000 | ORAL_TABLET | Freq: Two times a day (BID) | ORAL | 0 refills | Status: DC

## 2021-05-29 MED ORDER — LIDOCAINE VISCOUS HCL 2 % MT SOLN
15.0000 mL | OROMUCOSAL | 0 refills | Status: DC | PRN
Start: 2021-05-29 — End: 2023-03-17

## 2021-05-29 NOTE — ED Triage Notes (Signed)
Patient presents to Urgent Care with complaints of abscess inside R jaw, sore throat since 5/10. Patient reports broken tooth on that side, has not seen dentist recently pt is working on Copy.  ? ?

## 2021-05-29 NOTE — Discharge Instructions (Addendum)
-  Start the antibiotic-Augmentin (amoxicillin-clavulanate), 1 pill every 12 hours for 7 days.  You can take this with food like with breakfast and dinner. ?-For sore throat, use lidocaine mouthwash up to every 4 hours. Make sure not to eat for at least 1 hour after using this, as your mouth will be very numb and you could bite yourself. ?-You can take Tylenol up to 1000 mg 3 times daily, and ibuprofen up to 600 mg 3 times daily with food.  You can take these together, or alternate every 3-4 hours. ? ?

## 2021-05-29 NOTE — ED Provider Notes (Signed)
?[place] ? ? ? ?CSN: [id number] ?Arrival date & time: 05/29/21  1018 ? ? ?  ? ?History   ?Chief Complaint ?Chief Complaint  ?Patient presents with  ? Abscess  ?  Right side of jaw swollen.  Pain cant eat - Entered by patient  ? ? ?HPI ?Carolyn Bryant is a 41 y.o. female presenting with concern for dental infection.  History recurrent dental infection, states that she is working on getting a Contractor.  Describes pain and swelling over the right jaw, with pain surrounding the right lower molars.  She denies pain under the tongue or jaw.  She does incidentally mention a sore throat, without trouble swallowing, voice changes, shortness of breath.  Symptoms for about 2 days. ? ?HPI ? ?Past Medical History:  ?Diagnosis Date  ? Asthma   ? ? ?There are no problems to display for this patient. ? ? ?Past Surgical History:  ?Procedure Laterality Date  ? TUBAL LIGATION    ? ? ?OB History   ? ? Gravida  ?3  ? Para  ?3  ? Term  ?3  ? Preterm  ?   ? AB  ?0  ? Living  ?2  ?  ? ? SAB  ?0  ? IAB  ?   ? Ectopic  ?   ? Multiple  ?   ? Live Births  ?   ?   ?  ?  ? ? ? ?Home Medications   ? ?Prior to Admission medications   ?Medication Sig Start Date End Date Taking? Authorizing Provider  ?amoxicillin-clavulanate (AUGMENTIN) 875-125 MG tablet Take 1 tablet by mouth every 12 (twelve) hours. 05/29/21  Yes Hazel Sams, PA-C  ?lidocaine (XYLOCAINE) 2 % solution Use as directed 15 mLs in the mouth or throat every 4 (four) hours as needed for mouth pain. Swish/gargle and spit 05/29/21  Yes Hazel Sams, PA-C  ?acetaminophen (TYLENOL) 500 MG tablet Take 1 tablet (500 mg total) by mouth every 6 (six) hours as needed. 12/22/20   Domenic Moras, PA-C  ?benzonatate (TESSALON) 100 MG capsule Take 1 capsule (100 mg total) by mouth every 8 (eight) hours. 12/22/20   Domenic Moras, PA-C  ? ? ?Family History ?History reviewed. No pertinent family history. ? ?Social History ?Social History  ? ?Tobacco Use  ? Smoking status: Every Day  ?   Packs/day: 0.10  ?  Types: Cigarettes  ? Smokeless tobacco: Never  ?Vaping Use  ? Vaping Use: Never used  ?Substance Use Topics  ? Alcohol use: Yes  ?  Comment: occasional  ? Drug use: No  ? ? ? ?Allergies   ?Peanuts [peanut oil] and Prednisone ? ? ?Review of Systems ?Review of Systems  ?HENT:  Positive for dental problem.   ?All other systems reviewed and are negative. ? ? ?Physical Exam ?Triage Vital Signs ?ED Triage Vitals  ?Enc Vitals Group  ?   BP 05/29/21 1102 111/75  ?   Pulse Rate 05/29/21 1102 74  ?   Resp 05/29/21 1102 18  ?   Temp 05/29/21 1102 97.9 ?F (36.6 ?C)  ?   Temp Source 05/29/21 1102 Oral  ?   SpO2 05/29/21 1102 95 %  ?   Weight --   ?   Height --   ?   Head Circumference --   ?   Peak Flow --   ?   Pain Score 05/29/21 1101 8  ?   Pain Loc --   ?  Pain Edu? --   ?   Excl. in [place]? --   ? ?No data found. ? ?Updated Vital Signs ?BP 111/75   Pulse 74   Temp 97.9 ?F (36.6 ?C) (Oral)   Resp 18   LMP 05/09/2021 (Approximate)   SpO2 95%  ? ?Visual Acuity ?Right Eye Distance:   ?Left Eye Distance:   ?Bilateral Distance:   ? ?Right Eye Near:   ?Left Eye Near:    ?Bilateral Near:    ? ?Physical Exam ?Vitals reviewed.  ?Constitutional:   ?   General: She is not in acute distress. ?   Appearance: Normal appearance. She is not ill-appearing, toxic-appearing or diaphoretic.  ?HENT:  ?   Head: Normocephalic and atraumatic.  ?   Jaw: There is normal jaw occlusion. No trismus, tenderness, swelling, pain on movement or malocclusion.  ?   Salivary Glands: Right salivary gland is not diffusely enlarged or tender. Left salivary gland is not diffusely enlarged or tender.  ?   Right Ear: Hearing normal.  ?   Left Ear: Hearing normal.  ?   Nose: Nose normal.  ?   Mouth/Throat:  ?   Lips: Pink.  ?   Mouth: Mucous membranes are moist. No lacerations or oral lesions.  ?   Dentition: Abnormal dentition. Does not have dentures. Dental tenderness and dental caries present.  ?   Tongue: No lesions. Tongue does not deviate  from midline.  ?   Palate: No mass.  ?   Pharynx: Oropharynx is clear. Uvula midline. No oropharyngeal exudate or posterior oropharyngeal erythema.  ?   Tonsils: No tonsillar exudate or tonsillar abscesses.  ?   Comments: Poor dentician  ?Gingival tenderness surrounding R lower molars. Mild effusion R jaw, does not extend under the jaw. No posterior pharyngeal erythema or swelling, uvula midline. No trismus, drooling, voice changes, swelling underneath the tongue, swelling underneath the jaw, neck stiffness. ? ?Eyes:  ?   Extraocular Movements: Extraocular movements intact.  ?   Pupils: Pupils are equal, round, and reactive to light.  ?Pulmonary:  ?   Effort: Pulmonary effort is normal.  ?Neurological:  ?   General: No focal deficit present.  ?   Mental Status: She is alert and oriented to person, place, and time.  ?Psychiatric:     ?   Mood and Affect: Mood normal.     ?   Behavior: Behavior normal.     ?   Thought Content: Thought content normal.     ?   Judgment: Judgment normal.  ? ? ? ?UC Treatments / Results  ?Labs ?(all labs ordered are listed, but only abnormal results are displayed) ?Labs Reviewed - No data to display ? ?EKG ? ? ?Radiology ?No results found. ? ?Procedures ?Procedures (including critical care time) ? ?Medications Ordered in UC ?Medications - No data to display ? ?Initial Impression / Assessment and Plan / UC Course  ?I have reviewed the triage vital signs and the nursing notes. ? ?Pertinent labs & imaging results that were available during my care of the patient were reviewed by me and considered in my medical decision making (see chart for details). ? ?  ? ?This patient is a very pleasant 41 y.o. year old female presenting with dental infection. Afebrile, nontachy. Will manage with augmentin and viscous lidocaine. Patient is working on establishing care with dentist. ED return precautions discussed. Patient verbalizes understanding and agreement.  ? ?Final Clinical Impressions(s) / UC  Diagnoses  ? ?  Final diagnoses:  ?Dental infection  ? ? ? ?Discharge Instructions   ? ?  ?-Start the antibiotic-Augmentin (amoxicillin-clavulanate), 1 pill every 12 hours for 7 days.  You can take this with food like with breakfast and dinner. ?-For sore throat, use lidocaine mouthwash up to every 4 hours. Make sure not to eat for at least 1 hour after using this, as your mouth will be very numb and you could bite yourself. ?-You can take Tylenol up to 1000 mg 3 times daily, and ibuprofen up to 600 mg 3 times daily with food.  You can take these together, or alternate every 3-4 hours. ? ? ? ?ED Prescriptions   ? ? Medication Sig Dispense Auth. Provider  ? amoxicillin-clavulanate (AUGMENTIN) 875-125 MG tablet Take 1 tablet by mouth every 12 (twelve) hours. 14 tablet Hazel Sams, PA-C  ? lidocaine (XYLOCAINE) 2 % solution Use as directed 15 mLs in the mouth or throat every 4 (four) hours as needed for mouth pain. Swish/gargle and spit 100 mL Hazel Sams, PA-C  ? ?  ? ?PDMP not reviewed this encounter. ?  ?Hazel Sams, PA-C ?05/29/21 1234 ? ?

## 2021-06-25 ENCOUNTER — Ambulatory Visit (INDEPENDENT_AMBULATORY_CARE_PROVIDER_SITE_OTHER): Payer: PRIVATE HEALTH INSURANCE

## 2021-06-25 ENCOUNTER — Ambulatory Visit
Admission: RE | Admit: 2021-06-25 | Discharge: 2021-06-25 | Disposition: A | Discharge status: Home / Self Care | Payer: PRIVATE HEALTH INSURANCE | Source: Ambulatory Visit | Attending: Internal Medicine | Admitting: Internal Medicine

## 2021-06-25 VITALS — BP 114/82 | HR 92 | Temp 98.0°F | Resp 18

## 2021-06-25 DIAGNOSIS — M25561 Pain in right knee: Secondary | ICD-10-CM | POA: Diagnosis not present

## 2021-06-25 DIAGNOSIS — D367 Benign neoplasm of other specified sites: Secondary | ICD-10-CM | POA: Diagnosis not present

## 2021-06-25 NOTE — Discharge Instructions (Signed)
Please follow-up with orthopedist for knee pain.  Follow-up with PCP or general surgery for cystlike lesion to right leg.

## 2021-06-25 NOTE — ED Provider Notes (Signed)
[name]    CSN: [id number] Arrival date & time: 06/25/21  0951      History   Chief Complaint Chief Complaint  Patient presents with   Knee Pain    My knee hurts to bend apply lot of pressure.  Also have a lil knot on side of same leg that hurts - Entered by patient    HPI Carolyn Bryant is a 41 y.o. female.   Patient presents with right knee pain and lump on the leg that has been present for approximately 1 week.  Patient reports that she noticed a lump in the right lateral lower leg approximately 1 week ago.  It is painful.  Denies any obvious insect bites or injury to the area.  Right knee pain started a few days after noticing the lump.  Denies any obvious injury to the leg or knee.  Patient having difficulty bearing weight and bending knee.  Patient has taken Tylenol for neck pain with minimal improvement.  Denies any numbness or tingling.  Denies any history of chronic knee pain.   Knee Pain  Past Medical History:  Diagnosis Date   Asthma     There are no problems to display for this patient.   Past Surgical History:  Procedure Laterality Date   TUBAL LIGATION      OB History     Gravida  3   Para  3   Term  3   Preterm      AB  0   Living  2      SAB  0   IAB      Ectopic      Multiple      Live Births               Home Medications    Prior to Admission medications   Medication Sig Start Date End Date Taking? Authorizing Provider  acetaminophen (TYLENOL) 500 MG tablet Take 1 tablet (500 mg total) by mouth every 6 (six) hours as needed. 12/22/20   Domenic Moras, PA-C  amoxicillin-clavulanate (AUGMENTIN) 875-125 MG tablet Take 1 tablet by mouth every 12 (twelve) hours. 05/29/21   Hazel Sams, PA-C  benzonatate (TESSALON) 100 MG capsule Take 1 capsule (100 mg total) by mouth every 8 (eight) hours. 12/22/20   Domenic Moras, PA-C  lidocaine (XYLOCAINE) 2 % solution Use as directed 15 mLs in the mouth or throat every 4  (four) hours as needed for mouth pain. Swish/gargle and spit 05/29/21   Hazel Sams, PA-C    Family History History reviewed. No pertinent family history.  Social History Social History   Tobacco Use   Smoking status: Every Day    Packs/day: 0.10    Types: Cigarettes   Smokeless tobacco: Never  Vaping Use   Vaping Use: Never used  Substance Use Topics   Alcohol use: Yes    Comment: occasional   Drug use: No     Allergies   Peanuts [peanut oil] and Prednisone   Review of Systems Review of Systems Per HPI  Physical Exam Triage Vital Signs ED Triage Vitals  Enc Vitals Group     BP 06/25/21 1013 114/82     Pulse Rate 06/25/21 1013 92     Resp 06/25/21 1013 18     Temp 06/25/21 1013 98 F (36.7 C)     Temp Source 06/25/21 1013 Oral     SpO2 06/25/21 1013 98 %  Weight --      Height --      Head Circumference --      Peak Flow --      Pain Score 06/25/21 1012 8     Pain Loc --      Pain Edu? --      Excl. in [place]? --    No data found.  Updated Vital Signs BP 114/82 (BP Location: Right Arm)   Pulse 92   Temp 98 F (36.7 C) (Oral)   Resp 18   LMP 06/09/2021   SpO2 98%   Visual Acuity Right Eye Distance:   Left Eye Distance:   Bilateral Distance:    Right Eye Near:   Left Eye Near:    Bilateral Near:     Physical Exam Constitutional:      General: She is not in acute distress.    Appearance: Normal appearance. She is not toxic-appearing or diaphoretic.  HENT:     Head: Normocephalic and atraumatic.  Eyes:     Extraocular Movements: Extraocular movements intact.     Conjunctiva/sclera: Conjunctivae normal.  Pulmonary:     Effort: Pulmonary effort is normal.  Skin:    Comments: Approximately 1 cm in diameter flesh-colored nonmobile indurated circular area present to right lateral leg about halfway up.  Tenderness to palpation generalized throughout majority of right knee but specifically located in center of patella.  Pain with range of  motion.  Patient has full range of motion.  No crepitus noted.  Neurovascular intact.  Neurological:     General: No focal deficit present.     Mental Status: She is alert and oriented to person, place, and time. Mental status is at baseline.  Psychiatric:        Mood and Affect: Mood normal.        Behavior: Behavior normal.        Thought Content: Thought content normal.        Judgment: Judgment normal.     UC Treatments / Results  Labs (all labs ordered are listed, but only abnormal results are displayed) Labs Reviewed - No data to display  EKG   Radiology DG Knee Complete 4 Views Right  Result Date: 06/25/2021 CLINICAL DATA:  Right knee pain.  Lump on knee with no known injury. EXAM: RIGHT KNEE - COMPLETE 4+ VIEW COMPARISON:  Right knee radiographs 12/27/2018 FINDINGS: There is again a centrally lucent and peripherally sclerotic lesion within the proximal medial tibial metadiaphysis posteriorly consistent with a benign fibro-osseous lesion and likely a non ossifying fibroma. No joint effusion. No calcified soft tissue mass is seen. IMPRESSION: No significant osteoarthritis.  No joint effusion. 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 Electronically Signed   By: Yvonne Kendall M.D.   On: 06/25/2021 10:53    Procedures Procedures (including critical care time)  Medications Ordered in UC Medications - No data to display  Initial Impression / Assessment and Plan / UC Course  I have reviewed the triage vital signs and the nursing notes.  Pertinent labs & imaging results that were available during my care of the patient were reviewed by me and considered in my medical decision making (see chart for details).     Lesion to right outer leg appears to be a lipoma versus dermoid cyst.  No concern for abscess or infection.  It is not located in area of lymph node swelling so no concern for this.  Do not think that this  lesion and knee pain are related. Patient  advised to follow up with PCP and/or provided contact information for general surgery for further evaluation and management given that it is causing her discomfort.   Right knee x-ray was showing signs of possible benign fibroma.  Do not think this is related to knee pain.  Although, does warrant further evaluation given patient is having associated pain.  Will apply knee brace as there is concern for muscular injury/sprain.  Discussed supportive care, ice application, over-the-counter pain relievers.  Patient to follow-up with orthopedist at provided contact information for further evaluation and management.  Patient verbalized understanding and was agreeable with plan. Final Clinical Impressions(s) / UC Diagnoses   Final diagnoses:  Acute pain of right knee  Dermoid cyst of leg, right     Discharge Instructions      Please follow-up with orthopedist for knee pain.  Follow-up with PCP or general surgery for cystlike lesion to right leg.     ED Prescriptions   None    PDMP not reviewed this encounter.   Teodora Medici, [place] 06/25/21 [phone]

## 2021-06-25 NOTE — ED Triage Notes (Signed)
Pt presents with right knee pain & lump on knee with no known injury.

## 2021-07-04 ENCOUNTER — Encounter: Payer: Self-pay | Admitting: Physician Assistant

## 2021-07-04 ENCOUNTER — Ambulatory Visit (INDEPENDENT_AMBULATORY_CARE_PROVIDER_SITE_OTHER): Payer: PRIVATE HEALTH INSURANCE | Admitting: Physician Assistant

## 2021-07-04 DIAGNOSIS — M25561 Pain in right knee: Secondary | ICD-10-CM

## 2021-07-04 HISTORY — DX: Pain in right knee: M25.561

## 2021-07-04 MED ORDER — MELOXICAM 15 MG PO TABS: 15.0000 mg | ORAL_TABLET | Freq: Every day | ORAL | 0 refills | Status: DC

## 2021-07-04 NOTE — Progress Notes (Signed)
Office Visit Note   Patient: Carolyn Bryant           Date of Birth: Jul 04, 1980           MRN: [id number] Visit Date: 07/04/2021              Requested by: No referring provider defined for this encounter. PCP: Pcp, No  Chief Complaint  Patient presents with   Right Knee - New Patient (Initial Visit)      HPI: Patient is a pleasant 41 year old woman with a 2-week history of right knee pain.  She denies any injuries.  She does notice she started having pain in the front of her knee a couple weeks ago.  Going up and down stairs is particularly difficult for her.  She was seen and evaluated by the urgent care.  They did give her a spider brace.  X-rays done there did not show anything significant arthritic she does have a benign fibroma in the proximal tibia.  She denies any locking and catching.  Points to her pain around the anterior knee  Assessment & Plan: Visit Diagnoses:  1. Acute pain of right knee     Plan: Findings consistent with patellofemoral syndrome.  Discussed this with her.  Showed her an exercise to do for some quadricep strengthening close chain.  I called her in some meloxicam.  Also given her information about Voltaren gel.  I did offer her an injection but she does have concerns about getting prednisone.  She will follow-up in 5 weeks for reevaluation  Follow-Up Instructions: No follow-ups on file.   Ortho Exam  Patient is alert, oriented, no adenopathy, well-dressed, normal affect, normal respiratory effort. Examination of her right knee no effusion no redness no cellulitis.  She does have some quad atrophy.  Mild patellar grinding with range of motion.  She has pain focused around the patella.  She is able to sustain a straight leg raise though this is somewhat weak and her leg shakes.  Good varus valgus stability  Imaging: No results found. No images are attached to the encounter.  Labs: Lab Results  Component Value Date   REPTSTATUS 02/21/2020 FINAL  02/20/2020   CULT MULTIPLE SPECIES PRESENT, SUGGEST RECOLLECTION (A) 02/20/2020     Lab Results  Component Value Date   ALBUMIN 3.7 02/19/2020   ALBUMIN 4.0 02/17/2020   ALBUMIN 3.8 08/01/2016    No results found for: "MG" No results found for: "VD25OH"  No results found for: "PREALBUMIN"    Latest Ref Rng & Units 02/19/2020   10:30 PM 02/17/2020    7:25 PM 08/01/2016   12:45 PM  CBC EXTENDED  WBC 4.0 - 10.5 K/uL 10.6  8.2  6.4   RBC 3.87 - 5.11 MIL/uL 3.44  3.48  3.75   Hemoglobin 12.0 - 15.0 g/dL 9.2  9.2  11.8   HCT 36.0 - 46.0 % 29.1  29.9  34.7   Platelets 150 - 400 K/uL 438  354  362   NEUT# 1.4 - 6.5 K/uL   4.2   Lymph# 1.0 - 3.6 K/uL   1.6      There is no height or weight on file to calculate BMI.  Orders:  Orders Placed This Encounter  Procedures   Ambulatory referral to Physical Therapy   Meds ordered this encounter  Medications   meloxicam (MOBIC) 15 MG tablet    Sig: Take 1 tablet (15 mg total) by mouth daily.  Dispense:  30 tablet    Refill:  0     Procedures: No procedures performed  Clinical Data: No additional findings.  ROS:  All other systems negative, except as noted in the HPI. Review of Systems  Objective: Vital Signs: LMP 06/09/2021   Specialty Comments:  No specialty comments available.  PMFS History: There are no problems to display for this patient.  Past Medical History:  Diagnosis Date   Asthma     History reviewed. No pertinent family history.  Past Surgical History:  Procedure Laterality Date   TUBAL LIGATION     Social History   Occupational History   Not on file  Tobacco Use   Smoking status: Every Day    Packs/day: 0.10    Types: Cigarettes   Smokeless tobacco: Never  Vaping Use   Vaping Use: Never used  Substance and Sexual Activity   Alcohol use: Yes    Comment: occasional   Drug use: No   Sexual activity: Not on file

## 2021-07-14 ENCOUNTER — Ambulatory Visit: Payer: PRIVATE HEALTH INSURANCE | Admitting: Rehabilitative and Restorative Service Providers"

## 2021-09-13 ENCOUNTER — Encounter (HOSPITAL_COMMUNITY): Payer: Self-pay

## 2021-09-13 ENCOUNTER — Emergency Department (HOSPITAL_COMMUNITY)
Admission: EM | Admit: 2021-09-13 | Discharge: 2021-09-13 | Discharge status: Against Medical Advice | Payer: PRIVATE HEALTH INSURANCE | Attending: Emergency Medicine | Admitting: Emergency Medicine

## 2021-09-13 DIAGNOSIS — K0889 Other specified disorders of teeth and supporting structures: Secondary | ICD-10-CM | POA: Insufficient documentation

## 2021-09-13 DIAGNOSIS — Z5321 Procedure and treatment not carried out due to patient leaving prior to being seen by health care provider: Secondary | ICD-10-CM | POA: Diagnosis not present
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
## 2021-09-14 ENCOUNTER — Ambulatory Visit
Admission: EM | Admit: 2021-09-14 | Discharge: 2021-09-14 | Disposition: A | Discharge status: Home / Self Care | Payer: PRIVATE HEALTH INSURANCE | Attending: Internal Medicine | Admitting: Internal Medicine

## 2021-09-14 VITALS — BP 128/87 | HR 79 | Temp 98.1°F | Resp 18

## 2021-09-14 DIAGNOSIS — K047 Periapical abscess without sinus: Secondary | ICD-10-CM

## 2021-09-14 DIAGNOSIS — K0889 Other specified disorders of teeth and supporting structures: Secondary | ICD-10-CM | POA: Diagnosis not present

## 2021-09-14 MED ORDER — AMOXICILLIN-POT CLAVULANATE 875-125 MG PO TABS: 1.0000 | ORAL_TABLET | Freq: Two times a day (BID) | ORAL | 0 refills | Status: DC

## 2021-09-14 MED ORDER — KETOROLAC TROMETHAMINE 30 MG/ML IJ SOLN
30.0000 mg | Freq: Once | INTRAMUSCULAR | Status: AC
  Administered 2021-09-14: 30 mg via INTRAMUSCULAR

## 2021-09-14 NOTE — ED Provider Notes (Signed)
[name]    CSN: [id number] Arrival date & time: 09/14/21  6761      History   Chief Complaint Chief Complaint  Patient presents with   Dental Problem    Left side and front top of lip and face swollen painful. - Entered by patient   Abscess    HPI Carolyn Bryant is a 41 y.o. female.   Patient presents with left upper dental pain that started about 3 days ago.  Patient reports history of recurrent dental infections.  She has never seen a dentist given financial complications.  Denies any purulent drainage, fever, body aches, chills.  Patient has taken ibuprofen and Tylenol yesterday for symptoms.   Abscess   Past Medical History:  Diagnosis Date   Asthma     Patient Active Problem List   Diagnosis Date Noted   Pain in right knee 07/04/2021    Past Surgical History:  Procedure Laterality Date   TUBAL LIGATION      OB History     Gravida  3   Para  3   Term  3   Preterm      AB  0   Living  2      SAB  0   IAB      Ectopic      Multiple      Live Births               Home Medications    Prior to Admission medications   Medication Sig Start Date End Date Taking? Authorizing Provider  amoxicillin-clavulanate (AUGMENTIN) 875-125 MG tablet Take 1 tablet by mouth every 12 (twelve) hours. 09/14/21  Yes Tilton Marsalis, Hildred Alamin E, FNP  acetaminophen (TYLENOL) 500 MG tablet Take 1 tablet (500 mg total) by mouth every 6 (six) hours as needed. 12/22/20   Domenic Moras, PA-C  benzonatate (TESSALON) 100 MG capsule Take 1 capsule (100 mg total) by mouth every 8 (eight) hours. 12/22/20   Domenic Moras, PA-C  lidocaine (XYLOCAINE) 2 % solution Use as directed 15 mLs in the mouth or throat every 4 (four) hours as needed for mouth pain. Swish/gargle and spit 05/29/21   Hazel Sams, PA-C  meloxicam (MOBIC) 15 MG tablet Take 1 tablet (15 mg total) by mouth daily. 07/04/21   Persons, Bevely Palmer, PA    Family History History reviewed. No pertinent family  history.  Social History Social History   Tobacco Use   Smoking status: Every Day    Packs/day: 0.10    Types: Cigarettes   Smokeless tobacco: Never  Vaping Use   Vaping Use: Never used  Substance Use Topics   Alcohol use: Yes    Comment: occasional   Drug use: No     Allergies   Peanuts [peanut oil] and Prednisone   Review of Systems Review of Systems Per HPI  Physical Exam Triage Vital Signs ED Triage Vitals  Enc Vitals Group     BP 09/14/21 0901 128/87     Pulse Rate 09/14/21 0901 79     Resp 09/14/21 0901 18     Temp 09/14/21 0901 98.1 F (36.7 C)     Temp src --      SpO2 09/14/21 0901 98 %     Weight --      Height --      Head Circumference --      Peak Flow --      Pain Score 09/14/21 0900 7  Pain Loc --      Pain Edu? --      Excl. in [place]? --    No data found.  Updated Vital Signs BP 128/87   Pulse 79   Temp 98.1 F (36.7 C)   Resp 18   LMP 08/26/2021 (Approximate)   SpO2 98%   Visual Acuity Right Eye Distance:   Left Eye Distance:   Bilateral Distance:    Right Eye Near:   Left Eye Near:    Bilateral Near:     Physical Exam Constitutional:      General: She is not in acute distress.    Appearance: Normal appearance. She is not toxic-appearing or diaphoretic.  HENT:     Head: Normocephalic and atraumatic.     Mouth/Throat:     Dentition: Abnormal dentition. Dental tenderness and gingival swelling present.      Comments: Diffuse abnormal dentition throughout.  Patient does have some left upper front mild erythema and swelling. Eyes:     Extraocular Movements: Extraocular movements intact.     Conjunctiva/sclera: Conjunctivae normal.  Pulmonary:     Effort: Pulmonary effort is normal.  Neurological:     General: No focal deficit present.     Mental Status: She is alert and oriented to person, place, and time. Mental status is at baseline.  Psychiatric:        Mood and Affect: Mood normal.        Behavior: Behavior normal.         Thought Content: Thought content normal.        Judgment: Judgment normal.      UC Treatments / Results  Labs (all labs ordered are listed, but only abnormal results are displayed) Labs Reviewed - No data to display  EKG   Radiology No results found.  Procedures Procedures (including critical care time)  Medications Ordered in UC Medications  ketorolac (TORADOL) 30 MG/ML injection 30 mg (30 mg Intramuscular Given 09/14/21 0928)    Initial Impression / Assessment and Plan / UC Course  I have reviewed the triage vital signs and the nursing notes.  Pertinent labs & imaging results that were available during my care of the patient were reviewed by me and considered in my medical decision making (see chart for details).     Will treat dental infection with Augmentin antibiotic.  IM Toradol administered in urgent care.  Patient advised to avoid any NSAIDs for least 24 hours.  Patient provided with dental resources information for follow-up.  Discussed return precautions.  Patient verbalized understanding and was agreeable with plan. Final Clinical Impressions(s) / UC Diagnoses   Final diagnoses:  Dental infection  Pain, dental     Discharge Instructions      You have been prescribed Augmentin antibiotic for dental infection.  An injection was given today in urgent care for pain.  Please avoid taking any ibuprofen, Advil, Aleve for at least 24 hours following injection.  Follow-up with dentist for further evaluation and management.    ED Prescriptions     Medication Sig Dispense Auth. Provider   amoxicillin-clavulanate (AUGMENTIN) 875-125 MG tablet Take 1 tablet by mouth every 12 (twelve) hours. 14 tablet [place], Michele Rockers, [place]      PDMP not reviewed this encounter.   Teodora Medici, [place] 09/14/21 [phone]

## 2021-09-14 NOTE — ED Triage Notes (Signed)
Pt is present today with c/o left side dental pain. Pt sx started x2 days ago

## 2021-09-14 NOTE — Discharge Instructions (Signed)
You have been prescribed Augmentin antibiotic for dental infection.  An injection was given today in urgent care for pain.  Please avoid taking any ibuprofen, Advil, Aleve for at least 24 hours following injection.  Follow-up with dentist for further evaluation and management.

## 2021-12-13 ENCOUNTER — Ambulatory Visit
Admission: EM | Admit: 2021-12-13 | Discharge: 2021-12-13 | Disposition: A | Discharge status: Home / Self Care | Payer: PRIVATE HEALTH INSURANCE | Attending: Physician Assistant | Admitting: Physician Assistant

## 2021-12-13 ENCOUNTER — Other Ambulatory Visit: Payer: Self-pay

## 2021-12-13 ENCOUNTER — Encounter: Payer: Self-pay | Admitting: Emergency Medicine

## 2021-12-13 DIAGNOSIS — J45901 Unspecified asthma with (acute) exacerbation: Secondary | ICD-10-CM | POA: Diagnosis not present

## 2021-12-13 MED ORDER — AZITHROMYCIN 250 MG PO TABS
250.0000 mg | ORAL_TABLET | Freq: Every day | ORAL | 0 refills | Status: DC
Start: 2021-12-13 — End: 2022-11-10

## 2021-12-13 MED ORDER — PREDNISONE 20 MG PO TABS: 40.0000 mg | ORAL_TABLET | Freq: Every day | ORAL | 0 refills | Status: AC

## 2021-12-13 NOTE — ED Provider Notes (Signed)
[name]    CSN: [id number] Arrival date & time: 12/13/21  1332      History   Chief Complaint Chief Complaint  Patient presents with   Generalized Body Aches   Cough    HPI Carolyn Bryant is a 41 y.o. female.   Patient here today for evaluation of cough, body aches, headache that started about a week ago. She has not had any nausea or vomiting. She denies fever. She has had some sore throat with cough. She denies any significant sinus pressure. She has not taken medication for symptoms. She does have history of asthma.   The history is provided by the patient.  Cough Associated symptoms: headaches, myalgias and sore throat   Associated symptoms: no chills, no ear pain, no eye discharge, no fever, no shortness of breath and no wheezing     Past Medical History:  Diagnosis Date   Asthma     Patient Active Problem List   Diagnosis Date Noted   Pain in right knee 07/04/2021    Past Surgical History:  Procedure Laterality Date   TUBAL LIGATION      OB History     Gravida  3   Para  3   Term  3   Preterm      AB  0   Living  2      SAB  0   IAB      Ectopic      Multiple      Live Births               Home Medications    Prior to Admission medications   Medication Sig Start Date End Date Taking? Authorizing Provider  azithromycin (ZITHROMAX) 250 MG tablet Take 1 tablet (250 mg total) by mouth daily. Take first 2 tablets together, then 1 every day until finished. 12/13/21  Yes Francene Finders, PA-C  predniSONE (DELTASONE) 20 MG tablet Take 2 tablets (40 mg total) by mouth daily with breakfast for 5 days. 12/13/21 12/18/21 Yes Francene Finders, PA-C  acetaminophen (TYLENOL) 500 MG tablet Take 1 tablet (500 mg total) by mouth every 6 (six) hours as needed. 12/22/20   Domenic Moras, PA-C  amoxicillin-clavulanate (AUGMENTIN) 875-125 MG tablet Take 1 tablet by mouth every 12 (twelve) hours. Patient not taking: Reported on  12/13/2021 09/14/21   Teodora Medici, FNP  benzonatate (TESSALON) 100 MG capsule Take 1 capsule (100 mg total) by mouth every 8 (eight) hours. 12/22/20   Domenic Moras, PA-C  lidocaine (XYLOCAINE) 2 % solution Use as directed 15 mLs in the mouth or throat every 4 (four) hours as needed for mouth pain. Swish/gargle and spit 05/29/21   Hazel Sams, PA-C  meloxicam (MOBIC) 15 MG tablet Take 1 tablet (15 mg total) by mouth daily. Patient not taking: Reported on 12/13/2021 07/04/21   Persons, Bevely Palmer, [place]    Family History History reviewed. No pertinent family history.  Social History Social History   Tobacco Use   Smoking status: Every Day    Packs/day: 0.10    Types: Cigarettes   Smokeless tobacco: Never  Vaping Use   Vaping Use: Never used  Substance Use Topics   Alcohol use: Yes    Comment: occasional   Drug use: No     Allergies   Peanuts [peanut oil] and Prednisone   Review of Systems Review of Systems  Constitutional:  Negative for chills and fever.  HENT:  Positive  for sore throat. Negative for congestion, ear pain and sinus pressure.   Eyes:  Negative for discharge and redness.  Respiratory:  Positive for cough. Negative for shortness of breath and wheezing.   Gastrointestinal:  Negative for abdominal pain, diarrhea, nausea and vomiting.  Musculoskeletal:  Positive for myalgias.  Neurological:  Positive for headaches.     Physical Exam Triage Vital Signs ED Triage Vitals [12/13/21 1356]  Enc Vitals Group     BP 138/64     Pulse Rate 64     Resp 18     Temp 97.9 F (36.6 C)     Temp Source Oral     SpO2 95 %     Weight      Height      Head Circumference      Peak Flow      Pain Score 5     Pain Loc      Pain Edu?      Excl. in [place]?    No data found.  Updated Vital Signs BP 138/64 (BP Location: Left Arm)   Pulse 64   Temp 97.9 F (36.6 C) (Oral)   Resp 18   SpO2 95%      Physical Exam Vitals and nursing note reviewed.  Constitutional:       General: She is not in acute distress.    Appearance: Normal appearance. She is not ill-appearing.  HENT:     Head: Normocephalic and atraumatic.     Nose: Congestion present.     Mouth/Throat:     Mouth: Mucous membranes are moist.     Pharynx: No oropharyngeal exudate or posterior oropharyngeal erythema.  Eyes:     Conjunctiva/sclera: Conjunctivae normal.  Cardiovascular:     Rate and Rhythm: Normal rate and regular rhythm.     Heart sounds: Normal heart sounds. No murmur heard. Pulmonary:     Effort: Pulmonary effort is normal. No respiratory distress.     Breath sounds: Normal breath sounds. No wheezing, rhonchi or rales.  Skin:    General: Skin is warm and dry.  Neurological:     Mental Status: She is alert.  Psychiatric:        Mood and Affect: Mood normal.        Thought Content: Thought content normal.      UC Treatments / Results  Labs (all labs ordered are listed, but only abnormal results are displayed) Labs Reviewed - No data to display  EKG   Radiology No results found.  Procedures Procedures (including critical care time)  Medications Ordered in UC Medications - No data to display  Initial Impression / Assessment and Plan / UC Course  I have reviewed the triage vital signs and the nursing notes.  Pertinent labs & imaging results that were available during my care of the patient were reviewed by me and considered in my medical decision making (see chart for details).    Will treat to cover asthma exacerbation with coverage for atypical pna. Steroid burst and Zpak prescribed. Patient does have prednisone listed as allergy, however allergy reported in 2016 and patient took without issue in 2019. Recommended follow up if no gradual improvement or with any further concerns.   Final Clinical Impressions(s) / UC Diagnoses   Final diagnoses:  Asthma with acute exacerbation, unspecified asthma severity, unspecified whether persistent   Discharge  Instructions   None    ED Prescriptions     Medication Sig Dispense Auth. Provider  predniSONE (DELTASONE) 20 MG tablet Take 2 tablets (40 mg total) by mouth daily with breakfast for 5 days. 10 tablet Ewell Poe F, PA-C   azithromycin (ZITHROMAX) 250 MG tablet Take 1 tablet (250 mg total) by mouth daily. Take first 2 tablets together, then 1 every day until finished. 6 tablet Francene Finders, PA-C      PDMP not reviewed this encounter.   Francene Finders, PA-C 12/13/21 1413

## 2021-12-13 NOTE — ED Triage Notes (Signed)
Pt sts cough and body aches x 1 week

## 2021-12-14 ENCOUNTER — Ambulatory Visit: Payer: Self-pay

## 2022-02-22 ENCOUNTER — Ambulatory Visit
Admission: EM | Admit: 2022-02-22 | Discharge: 2022-02-22 | Disposition: A | Discharge status: Home / Self Care | Payer: PRIVATE HEALTH INSURANCE | Attending: Physician Assistant | Admitting: Physician Assistant

## 2022-02-22 ENCOUNTER — Encounter: Payer: Self-pay | Admitting: Emergency Medicine

## 2022-02-22 ENCOUNTER — Other Ambulatory Visit: Payer: Self-pay

## 2022-02-22 DIAGNOSIS — J45901 Unspecified asthma with (acute) exacerbation: Secondary | ICD-10-CM | POA: Diagnosis not present

## 2022-02-22 MED ORDER — ALBUTEROL SULFATE HFA 108 (90 BASE) MCG/ACT IN AERS: 1.0000 | INHALATION_SPRAY | Freq: Four times a day (QID) | RESPIRATORY_TRACT | 0 refills | Status: DC | PRN

## 2022-02-22 MED ORDER — PREDNISONE 20 MG PO TABS: 40.0000 mg | ORAL_TABLET | Freq: Every day | ORAL | 0 refills | Status: AC

## 2022-02-22 NOTE — ED Provider Notes (Signed)
[name]    CSN: [id number] Arrival date & time: 02/22/22  0845      History   Chief Complaint Chief Complaint  Patient presents with   Cough    HPI Carolyn Bryant is a 42 y.o. female.   Patient here today for evaluation of cough congestion she has had for the last 4 days.  She has not had any fever.  She reports some sore throat and nausea but no vomiting.  She denies any diarrhea.  She does feel as if her asthma symptoms are worsening with increased wheezing and shortness of breath.  She notes she used her son's albuterol inhaler last night which was helpful.  The history is provided by the patient.    Past Medical History:  Diagnosis Date   Asthma     Patient Active Problem List   Diagnosis Date Noted   Pain in right knee 07/04/2021    Past Surgical History:  Procedure Laterality Date   TUBAL LIGATION      OB History     Gravida  3   Para  3   Term  3   Preterm      AB  0   Living  2      SAB  0   IAB      Ectopic      Multiple      Live Births               Home Medications    Prior to Admission medications   Medication Sig Start Date End Date Taking? Authorizing Provider  albuterol (VENTOLIN HFA) 108 (90 Base) MCG/ACT inhaler Inhale 1-2 puffs into the lungs every 6 (six) hours as needed for wheezing or shortness of breath. 02/22/22  Yes Francene Finders, PA-C  predniSONE (DELTASONE) 20 MG tablet Take 2 tablets (40 mg total) by mouth daily with breakfast for 5 days. 02/22/22 02/27/22 Yes Francene Finders, PA-C  acetaminophen (TYLENOL) 500 MG tablet Take 1 tablet (500 mg total) by mouth every 6 (six) hours as needed. 12/22/20   Domenic Moras, PA-C  amoxicillin-clavulanate (AUGMENTIN) 875-125 MG tablet Take 1 tablet by mouth every 12 (twelve) hours. Patient not taking: Reported on 12/13/2021 09/14/21   Teodora Medici, FNP  azithromycin (ZITHROMAX) 250 MG tablet Take 1 tablet (250 mg total) by mouth daily. Take first 2 tablets  together, then 1 every day until finished. 12/13/21   Francene Finders, PA-C  benzonatate (TESSALON) 100 MG capsule Take 1 capsule (100 mg total) by mouth every 8 (eight) hours. 12/22/20   Domenic Moras, PA-C  lidocaine (XYLOCAINE) 2 % solution Use as directed 15 mLs in the mouth or throat every 4 (four) hours as needed for mouth pain. Swish/gargle and spit 05/29/21   Hazel Sams, PA-C  meloxicam (MOBIC) 15 MG tablet Take 1 tablet (15 mg total) by mouth daily. Patient not taking: Reported on 12/13/2021 07/04/21   Persons, Bevely Palmer, [place]    Family History History reviewed. No pertinent family history.  Social History Social History   Tobacco Use   Smoking status: Every Day    Packs/day: 0.10    Types: Cigarettes   Smokeless tobacco: Never  Vaping Use   Vaping Use: Never used  Substance Use Topics   Alcohol use: Yes    Comment: occasional   Drug use: No     Allergies   Peanuts [peanut oil] and Prednisone   Review of Systems Review  of Systems  Constitutional:  Negative for chills and fever.  HENT:  Positive for congestion and sore throat. Negative for ear pain.   Eyes:  Negative for discharge and redness.  Respiratory:  Positive for cough, shortness of breath and wheezing.   Gastrointestinal:  Positive for nausea. Negative for abdominal pain, diarrhea and vomiting.     Physical Exam Triage Vital Signs ED Triage Vitals  Enc Vitals Group     BP      Pulse      Resp      Temp      Temp src      SpO2      Weight      Height      Head Circumference      Peak Flow      Pain Score      Pain Loc      Pain Edu?      Excl. in [place]?    No data found.  Updated Vital Signs BP 117/73 (BP Location: Left Arm)   Pulse 73   Temp 98.3 F (36.8 C) (Oral)   Resp 18   SpO2 97%      Physical Exam Vitals and nursing note reviewed.  Constitutional:      General: She is not in acute distress.    Appearance: Normal appearance. She is not ill-appearing.  HENT:     Head:  Normocephalic and atraumatic.     Nose: Congestion present.     Mouth/Throat:     Mouth: Mucous membranes are moist.     Pharynx: No oropharyngeal exudate or posterior oropharyngeal erythema.  Eyes:     Conjunctiva/sclera: Conjunctivae normal.  Cardiovascular:     Rate and Rhythm: Normal rate and regular rhythm.     Heart sounds: Normal heart sounds. No murmur heard. Pulmonary:     Effort: Pulmonary effort is normal. No respiratory distress.     Breath sounds: No wheezing, rhonchi or rales.     Comments: Breath sounds diminished throughout Skin:    General: Skin is warm and dry.  Neurological:     Mental Status: She is alert.  Psychiatric:        Mood and Affect: Mood normal.        Thought Content: Thought content normal.      UC Treatments / Results  Labs (all labs ordered are listed, but only abnormal results are displayed) Labs Reviewed - No data to display  EKG   Radiology No results found.  Procedures Procedures (including critical care time)  Medications Ordered in UC Medications - No data to display  Initial Impression / Assessment and Plan / UC Course  I have reviewed the triage vital signs and the nursing notes.  Pertinent labs & imaging results that were available during my care of the patient were reviewed by me and considered in my medical decision making (see chart for details).    Will treat to cover asthma exacerbation. Steroid burst and albuterol inhaler prescribed. Encouraged follow up if no gradual improvement or with any further concerns.   Final Clinical Impressions(s) / UC Diagnoses   Final diagnoses:  Asthma with acute exacerbation, unspecified asthma severity, unspecified whether persistent   Discharge Instructions   None    ED Prescriptions     Medication Sig Dispense Auth. Provider   predniSONE (DELTASONE) 20 MG tablet Take 2 tablets (40 mg total) by mouth daily with breakfast for 5 days. 10 tablet Francene Finders, PA-C  albuterol (VENTOLIN HFA) 108 (90 Base) MCG/ACT inhaler Inhale 1-2 puffs into the lungs every 6 (six) hours as needed for wheezing or shortness of breath. 8 g Francene Finders, PA-C      PDMP not reviewed this encounter.   Francene Finders, PA-C 02/22/22 [phone]

## 2022-02-22 NOTE — ED Triage Notes (Signed)
Pt here for cough and congestion x 4 days; pt feels like her asthma is acting up as well

## 2022-02-23 ENCOUNTER — Ambulatory Visit: Payer: Self-pay

## 2022-05-10 ENCOUNTER — Ambulatory Visit: Payer: PRIVATE HEALTH INSURANCE

## 2022-05-10 ENCOUNTER — Ambulatory Visit
Admission: RE | Admit: 2022-05-10 | Discharge: 2022-05-10 | Disposition: A | Discharge status: Home / Self Care | Payer: PRIVATE HEALTH INSURANCE | Source: Ambulatory Visit | Attending: Internal Medicine | Admitting: Internal Medicine

## 2022-05-10 VITALS — BP 121/76 | HR 70 | Temp 98.3°F | Resp 16 | Ht 62.0 in | Wt 135.0 lb

## 2022-05-10 DIAGNOSIS — M79642 Pain in left hand: Secondary | ICD-10-CM | POA: Diagnosis not present

## 2022-05-10 DIAGNOSIS — M25532 Pain in left wrist: Secondary | ICD-10-CM

## 2022-05-10 MED ORDER — PREDNISONE 20 MG PO TABS: 40.0000 mg | ORAL_TABLET | Freq: Every day | ORAL | 0 refills | Status: AC

## 2022-05-10 NOTE — ED Triage Notes (Signed)
Patient c/o left middle finger pain x 2-3 days ago.  No apparent injury.  Pain radiates down wrist and arm.  Unable to make a complete fist.  Patient has taken Tylenol for pain.

## 2022-05-10 NOTE — Discharge Instructions (Signed)
X-ray was were normal.  As we discussed, suspect inflammation causing your symptoms.  Wear wrist brace that you may sleep in it.  Prednisone has been prescribed to decrease inflammation.  Follow-up with hand specialty if symptoms persist or worsen.

## 2022-05-10 NOTE — ED Provider Notes (Signed)
[name]    CSN: [id number] Arrival date & time: 05/10/22  4098      History   Chief Complaint Chief Complaint  Patient presents with   Finger Injury    Left ring finger hurts aches when I move it pain goes up down and beside two fingers - Entered by patient    HPI Carolyn Bryant is a 42 y.o. female.   Patient presents with left middle finger pain that has been present for a few days.  Patient denies any obvious injury.  She states that it originally started when she attempted to pick up a cup and her finger "gave way".  She states that majority the pain is in the middle finger but does seem to radiate to index finger and ring finger.  She states that any range of motion of her finger extends down into the palm of her hand and into her wrist.  Patient reports mild numbness and tingling with movement of the wrist.  Has taken Tylenol for pain with minimal improvement.  Patient states that she does a lot of repetitive movements at work.     Past Medical History:  Diagnosis Date   Asthma     Patient Active Problem List   Diagnosis Date Noted   Pain in right knee 07/04/2021    Past Surgical History:  Procedure Laterality Date   TUBAL LIGATION      OB History     Gravida  3   Para  3   Term  3   Preterm      AB  0   Living  2      SAB  0   IAB      Ectopic      Multiple      Live Births               Home Medications    Prior to Admission medications   Medication Sig Start Date End Date Taking? Authorizing Provider  predniSONE (DELTASONE) 20 MG tablet Take 2 tablets (40 mg total) by mouth daily for 5 days. 05/10/22 05/15/22 Yes Verina Galeno, Acie Fredrickson, FNP  acetaminophen (TYLENOL) 500 MG tablet Take 1 tablet (500 mg total) by mouth every 6 (six) hours as needed. 12/22/20   Fayrene Helper, PA-C  albuterol (VENTOLIN HFA) 108 (90 Base) MCG/ACT inhaler Inhale 1-2 puffs into the lungs every 6 (six) hours as needed for wheezing or shortness of  breath. 02/22/22   Tomi Bamberger, PA-C  amoxicillin-clavulanate (AUGMENTIN) 875-125 MG tablet Take 1 tablet by mouth every 12 (twelve) hours. Patient not taking: Reported on 12/13/2021 09/14/21   Gustavus Bryant, FNP  azithromycin (ZITHROMAX) 250 MG tablet Take 1 tablet (250 mg total) by mouth daily. Take first 2 tablets together, then 1 every day until finished. 12/13/21   Tomi Bamberger, PA-C  benzonatate (TESSALON) 100 MG capsule Take 1 capsule (100 mg total) by mouth every 8 (eight) hours. 12/22/20   Fayrene Helper, PA-C  lidocaine (XYLOCAINE) 2 % solution Use as directed 15 mLs in the mouth or throat every 4 (four) hours as needed for mouth pain. Swish/gargle and spit 05/29/21   Rhys Martini, PA-C  meloxicam (MOBIC) 15 MG tablet Take 1 tablet (15 mg total) by mouth daily. Patient not taking: Reported on 12/13/2021 07/04/21   Persons, [place], [place]    Family History History reviewed. No pertinent family history.  Social History Social History   Tobacco Use  Smoking status: Every Day    Packs/day: .1    Types: Cigarettes   Smokeless tobacco: Never  Vaping Use   Vaping Use: Never used  Substance Use Topics   Alcohol use: Yes    Comment: occasional   Drug use: No     Allergies   Peanuts [peanut oil] and Prednisone   Review of Systems Review of Systems Per HPI  Physical Exam Triage Vital Signs ED Triage Vitals  Enc Vitals Group     BP 05/10/22 0901 121/76     Pulse Rate 05/10/22 0901 70     Resp 05/10/22 0901 16     Temp 05/10/22 0901 98.3 F (36.8 C)     Temp Source 05/10/22 0901 Oral     SpO2 05/10/22 0901 98 %     Weight 05/10/22 0902 135 lb (61.2 kg)     Height 05/10/22 0902 5\' 2"  (1.575 m)     Head Circumference --      Peak Flow --      Pain Score 05/10/22 0902 7     Pain Loc --      Pain Edu? --      Excl. in [place]? --    No data found.  Updated Vital Signs BP 121/76 (BP Location: Left Arm)   Pulse 70   Temp 98.3 F (36.8 C) (Oral)   Resp 16   Ht  5\' 2"  (1.575 m)   Wt 135 lb (61.2 kg)   LMP 04/16/2022   SpO2 98%   BMI 24.69 kg/m   Visual Acuity Right Eye Distance:   Left Eye Distance:   Bilateral Distance:    Right Eye Near:   Left Eye Near:    Bilateral Near:     Physical Exam Constitutional:      General: She is not in acute distress.    Appearance: Normal appearance. She is not toxic-appearing or diaphoretic.  HENT:     Head: Normocephalic and atraumatic.  Eyes:     Extraocular Movements: Extraocular movements intact.     Conjunctiva/sclera: Conjunctivae normal.  Pulmonary:     Effort: Pulmonary effort is normal.  Musculoskeletal:     Comments: Tenderness to palpation throughout the left third digit.  Also has tenderness to palpation throughout the wrist and palm of hand.  There is no obvious swelling or discoloration noted.  Grip strength is decreased given pain.  Although, patient is neurovascularly intact with normal capillary refill and normal pulses. Full ROM of fingers present.   Neurological:     General: No focal deficit present.     Mental Status: She is alert and oriented to person, place, and time. Mental status is at baseline.  Psychiatric:        Mood and Affect: Mood normal.        Behavior: Behavior normal.        Thought Content: Thought content normal.        Judgment: Judgment normal.      UC Treatments / Results  Labs (all labs ordered are listed, but only abnormal results are displayed) Labs Reviewed - No data to display  EKG   Radiology DG Hand Complete Left  Result Date: 05/10/2022 CLINICAL DATA:  Hand and wrist pain EXAM: LEFT HAND - COMPLETE 3+ VIEW; LEFT WRIST - COMPLETE 3+ VIEW COMPARISON:  None Available. FINDINGS: There is no evidence of fracture or dislocation of the left hand or wrist. There is no evidence of significant arthropathy or other  focal bone abnormality. Soft tissues are unremarkable. IMPRESSION: Negative. Electronically Signed   By: Duanne Guess D.O.   On:  05/10/2022 09:52   DG Wrist Complete Left  Result Date: 05/10/2022 CLINICAL DATA:  Hand and wrist pain EXAM: LEFT HAND - COMPLETE 3+ VIEW; LEFT WRIST - COMPLETE 3+ VIEW COMPARISON:  None Available. FINDINGS: There is no evidence of fracture or dislocation of the left hand or wrist. There is no evidence of significant arthropathy or other focal bone abnormality. Soft tissues are unremarkable. IMPRESSION: Negative. Electronically Signed   By: Duanne Guess D.O.   On: 05/10/2022 09:52    Procedures Procedures (including critical care time)  Medications Ordered in UC Medications - No data to display  Initial Impression / Assessment and Plan / UC Course  I have reviewed the triage vital signs and the nursing notes.  Pertinent labs & imaging results that were available during my care of the patient were reviewed by me and considered in my medical decision making (see chart for details).     X-rays were negative for any bony abnormality.  Differential diagnoses include carpal tunnel versus tendinitis versus tenosynovitis.  No signs of bacterial infection on exam and patient has full range of motion of fingers.  Patient is also neurovascularly intact which is reassuring that no emergent evaluation is necessary.  Wrist brace applied to help keep the wrist stationary and in case carpal tunnel is a factor.  Advised supportive care.  It appears the patient has a history of low hemoglobin so will avoid NSAIDs.  Patient has taken prednisone before and tolerated well.  She reports prior history of being told that she had a reaction to prednisone but reports that she has taken it since with no reaction.  Therefore, will treat with steroid burst to help alleviate symptoms.  Advised following up with hand specialty for further evaluation and management.  Patient verbalized understanding and was agreeable with plan. Final Clinical Impressions(s) / UC Diagnoses   Final diagnoses:  Pain of left hand  Left  wrist pain     Discharge Instructions      X-ray was were normal.  As we discussed, suspect inflammation causing your symptoms.  Wear wrist brace that you may sleep in it.  Prednisone has been prescribed to decrease inflammation.  Follow-up with hand specialty if symptoms persist or worsen.     ED Prescriptions     Medication Sig Dispense Auth. Provider   predniSONE (DELTASONE) 20 MG tablet Take 2 tablets (40 mg total) by mouth daily for 5 days. 10 tablet Gustavus Bryant, [place]      PDMP not reviewed this encounter.   Gustavus Bryant, [place] 05/10/22 1047

## 2022-05-22 ENCOUNTER — Other Ambulatory Visit: Payer: Self-pay

## 2022-05-22 ENCOUNTER — Encounter (HOSPITAL_COMMUNITY): Payer: Self-pay

## 2022-05-22 ENCOUNTER — Emergency Department (HOSPITAL_COMMUNITY): Payer: PRIVATE HEALTH INSURANCE

## 2022-05-22 ENCOUNTER — Emergency Department (HOSPITAL_COMMUNITY)
Admission: EM | Admit: 2022-05-22 | Discharge: 2022-05-22 | Disposition: A | Discharge status: Home / Self Care | Payer: PRIVATE HEALTH INSURANCE | Attending: Emergency Medicine | Admitting: Emergency Medicine

## 2022-05-22 DIAGNOSIS — U071 COVID-19: Secondary | ICD-10-CM | POA: Diagnosis not present

## 2022-05-22 DIAGNOSIS — J45909 Unspecified asthma, uncomplicated: Secondary | ICD-10-CM | POA: Insufficient documentation

## 2022-05-22 DIAGNOSIS — R509 Fever, unspecified: Secondary | ICD-10-CM | POA: Diagnosis present

## 2022-05-22 DIAGNOSIS — Z91010 Allergy to peanuts: Secondary | ICD-10-CM | POA: Insufficient documentation

## 2022-05-22 LAB — CBC WITH DIFFERENTIAL/PLATELET
Abs Immature Granulocytes: 0.01 10*3/uL (ref 0.00–0.07)
Basophils Absolute: 0 10*3/uL (ref 0.0–0.1)
Basophils Relative: 0 %
Eosinophils Absolute: 0 10*3/uL (ref 0.0–0.5)
Eosinophils Relative: 0 %
HCT: 28.7 % — ABNORMAL LOW (ref 36.0–46.0)
Hemoglobin: 9 g/dL — ABNORMAL LOW (ref 12.0–15.0)
Immature Granulocytes: 0 %
Lymphocytes Relative: 17 %
Lymphs Abs: 0.5 10*3/uL — ABNORMAL LOW (ref 0.7–4.0)
MCH: 24.9 pg — ABNORMAL LOW (ref 26.0–34.0)
MCHC: 31.4 g/dL (ref 30.0–36.0)
MCV: 79.5 fL — ABNORMAL LOW (ref 80.0–100.0)
Monocytes Absolute: 0.4 10*3/uL (ref 0.1–1.0)
Monocytes Relative: 11 %
Neutro Abs: 2.4 10*3/uL (ref 1.7–7.7)
Neutrophils Relative %: 72 %
Platelets: 409 10*3/uL — ABNORMAL HIGH (ref 150–400)
RBC: 3.61 MIL/uL — ABNORMAL LOW (ref 3.87–5.11)
RDW: 19 % — ABNORMAL HIGH (ref 11.5–15.5)
WBC: 3.3 10*3/uL — ABNORMAL LOW (ref 4.0–10.5)
nRBC: 0 % (ref 0.0–0.2)

## 2022-05-22 LAB — URINALYSIS, ROUTINE W REFLEX MICROSCOPIC
Bilirubin Urine: NEGATIVE
Glucose, UA: NEGATIVE mg/dL
Ketones, ur: 5 mg/dL — AB
Nitrite: NEGATIVE
Protein, ur: NEGATIVE mg/dL
Specific Gravity, Urine: 1.014 (ref 1.005–1.030)
pH: 6 (ref 5.0–8.0)

## 2022-05-22 LAB — RESP PANEL BY RT-PCR (RSV, FLU A&B, COVID)  RVPGX2
Influenza A by PCR: NEGATIVE
Influenza B by PCR: NEGATIVE
Resp Syncytial Virus by PCR: NEGATIVE
SARS Coronavirus 2 by RT PCR: POSITIVE — AB

## 2022-05-22 LAB — BASIC METABOLIC PANEL
Anion gap: 9 (ref 5–15)
BUN: 6 mg/dL (ref 6–20)
CO2: 19 mmol/L — ABNORMAL LOW (ref 22–32)
Calcium: 8.8 mg/dL — ABNORMAL LOW (ref 8.9–10.3)
Chloride: 105 mmol/L (ref 98–111)
Creatinine, Ser: 0.73 mg/dL (ref 0.44–1.00)
GFR, Estimated: >60 mL/min (ref >60)
Glucose, Bld: 94 mg/dL (ref 70–99)
Potassium: 3.4 mmol/L — ABNORMAL LOW (ref 3.5–5.1)
Sodium: 133 mmol/L — ABNORMAL LOW (ref 135–145)

## 2022-05-22 LAB — PREGNANCY, URINE: Preg Test, Ur: NEGATIVE

## 2022-05-22 MED ORDER — ACETAMINOPHEN 325 MG PO TABS
650.0000 mg | ORAL_TABLET | Freq: Once | ORAL | Status: AC
  Administered 2022-05-22: 650 mg via ORAL
  Filled 2022-05-22: qty 2

## 2022-05-22 MED ORDER — ONDANSETRON 8 MG PO TBDP
8.0000 mg | ORAL_TABLET | Freq: Once | ORAL | Status: AC
  Administered 2022-05-22: 8 mg via ORAL
  Filled 2022-05-22: qty 1

## 2022-05-22 MED ORDER — ONDANSETRON HCL 4 MG PO TABS: 4.0000 mg | ORAL_TABLET | Freq: Three times a day (TID) | ORAL | 0 refills | Status: DC | PRN

## 2022-05-22 NOTE — ED Provider Notes (Signed)
[facility] AT [facility] Provider Note   CSN: [id number] Arrival date & time: 05/22/22  0501     History  Chief Complaint  Patient presents with   Fever   Generalized Body Aches    Carolyn Bryant is a 42 y.o. female.  Patient presents the emergency department via EMS complaining of fatigue, body aches, fever, congestion, and cough which is been ongoing for about 3 days.  She states that symptoms worsened yesterday and that she took over-the-counter cold medicine with little relief.  She denies known sick contacts.  Past medical history significant for asthma, tubal ligation  HPI     Home Medications Prior to Admission medications   Medication Sig Start Date End Date Taking? Authorizing Provider  ondansetron (ZOFRAN) 4 MG tablet Take 1 tablet (4 mg total) by mouth every 8 (eight) hours as needed for nausea or vomiting. 05/22/22  Yes Darrick Grinder, PA-C  acetaminophen (TYLENOL) 500 MG tablet Take 1 tablet (500 mg total) by mouth every 6 (six) hours as needed. 12/22/20   Fayrene Helper, PA-C  albuterol (VENTOLIN HFA) 108 (90 Base) MCG/ACT inhaler Inhale 1-2 puffs into the lungs every 6 (six) hours as needed for wheezing or shortness of breath. 02/22/22   Tomi Bamberger, PA-C  amoxicillin-clavulanate (AUGMENTIN) 875-125 MG tablet Take 1 tablet by mouth every 12 (twelve) hours. Patient not taking: Reported on 12/13/2021 09/14/21   Gustavus Bryant, FNP  azithromycin (ZITHROMAX) 250 MG tablet Take 1 tablet (250 mg total) by mouth daily. Take first 2 tablets together, then 1 every day until finished. 12/13/21   Tomi Bamberger, PA-C  benzonatate (TESSALON) 100 MG capsule Take 1 capsule (100 mg total) by mouth every 8 (eight) hours. 12/22/20   Fayrene Helper, PA-C  lidocaine (XYLOCAINE) 2 % solution Use as directed 15 mLs in the mouth or throat every 4 (four) hours as needed for mouth pain. Swish/gargle and spit 05/29/21   Rhys Martini, PA-C  meloxicam (MOBIC) 15  MG tablet Take 1 tablet (15 mg total) by mouth daily. Patient not taking: Reported on 12/13/2021 07/04/21   Persons, [place], [place]      Allergies    Peanuts [peanut oil] and Prednisone    Review of Systems   Review of Systems  Physical Exam Updated Vital Signs BP (!) 135/99 (BP Location: Right Arm)   Pulse 90   Temp 100.3 F (37.9 C) (Oral)   Resp 19   Ht 5\' 2"  (1.575 m)   Wt 61.2 kg   LMP 04/16/2022   SpO2 99%   BMI 24.69 kg/m  Physical Exam Vitals and nursing note reviewed.  Constitutional:      General: She is not in acute distress.    Appearance: She is well-developed. She is ill-appearing.  HENT:     Head: Normocephalic and atraumatic.     Nose: Congestion present.  Eyes:     Conjunctiva/sclera: Conjunctivae normal.  Cardiovascular:     Rate and Rhythm: Normal rate and regular rhythm.     Heart sounds: No murmur heard. Pulmonary:     Effort: Pulmonary effort is normal. No respiratory distress.     Breath sounds: Normal breath sounds.  Abdominal:     Palpations: Abdomen is soft.     Tenderness: There is no abdominal tenderness.  Musculoskeletal:        General: No swelling.     Cervical back: Neck supple.  Skin:    General:  Skin is warm and dry.     Capillary Refill: Capillary refill takes less than 2 seconds.  Neurological:     Mental Status: She is alert.  Psychiatric:        Mood and Affect: Mood normal.     ED Results / Procedures / Treatments   Labs (all labs ordered are listed, but only abnormal results are displayed) Labs Reviewed  RESP PANEL BY RT-PCR (RSV, FLU A&B, COVID)  RVPGX2 - Abnormal; Notable for the following components:      Result Value   SARS Coronavirus 2 by RT PCR POSITIVE (*)    All other components within normal limits  CBC WITH DIFFERENTIAL/PLATELET - Abnormal; Notable for the following components:   WBC 3.3 (*)    RBC 3.61 (*)    Hemoglobin 9.0 (*)    HCT 28.7 (*)    MCV 79.5 (*)    MCH 24.9 (*)    RDW 19.0 (*)     Platelets 409 (*)    Lymphs Abs 0.5 (*)    All other components within normal limits  BASIC METABOLIC PANEL - Abnormal; Notable for the following components:   Sodium 133 (*)    Potassium 3.4 (*)    CO2 19 (*)    Calcium 8.8 (*)    All other components within normal limits  URINALYSIS, ROUTINE W REFLEX MICROSCOPIC  PREGNANCY, URINE    EKG None  Radiology DG Chest 2 View  Result Date: 05/22/2022 CLINICAL DATA:  10031.  Cough, fever and body aches for 3 days. EXAM: CHEST - 2 VIEW COMPARISON:  PA Lat 05/28/2015 FINDINGS: The heart size and mediastinal contours are within normal limits. Both lungs are clear. The visualized skeletal structures are unremarkable. IMPRESSION: No evidence of acute chest disease.  Unchanged. Electronically Signed   By: Almira Bar M.D.   On: 05/22/2022 05:54    Procedures Procedures    Medications Ordered in ED Medications  ondansetron (ZOFRAN-ODT) disintegrating tablet 8 mg (8 mg Oral Given 05/22/22 0738)  acetaminophen (TYLENOL) tablet 650 mg (650 mg Oral Given 05/22/22 0739)    ED Course/ Medical Decision Making/ A&P                             Medical Decision Making  This patient presents to the ED for concern of fever, body aches, chills, this involves an extensive number of treatment options, and is a complaint that carries with it a high risk of complications and morbidity.  The differential diagnosis includes COVID, influenza, others   Co morbidities that complicate the patient evaluation  Asthma   Additional history obtained:  Additional history obtained from EMS    Lab Tests:  I Ordered, and personally interpreted labs.  The pertinent results include: Positive COVID test   Imaging Studies ordered:  I ordered imaging studies including chest x-ray I independently visualized and interpreted imaging which showed no acute findings I agree with the radiologist interpretation   Problem List / ED Course / Critical interventions /  Medication management   I ordered medication including Zofran and Tylenol  Reevaluation of the patient after these medicines showed that the patient improved I have reviewed the patients home medicines and have made adjustments as needed   Social Determinants of Health:  Patient has no primary care provider   Test / Admission - Considered:  Patient with positive COVID test result, consistent with patient's symptoms and presentation.  Patient is 3 days into her illness with no significant comorbidities.  Plan to discharge home with Zofran prescription and recommendations for over-the-counter medications such as Tylenol and ibuprofen for symptom management.  Return precautions provided         Final Clinical Impression(s) / ED Diagnoses Final diagnoses:  COVID    Rx / DC Orders ED Discharge Orders          Ordered    ondansetron (ZOFRAN) 4 MG tablet  Every 8 hours PRN        05/22/22 0743              Darrick Grinder, PA-C 05/22/22 0744    Terald Sleeper, MD 05/22/22 [phone]

## 2022-05-22 NOTE — Discharge Instructions (Addendum)
You were evaluated today and diagnosed with COVID.  Please take over-the-counter medications such as ibuprofen and acetaminophen as needed for fever and pain control.  Take the Zofran as prescribed for nausea. Please get rest and hydrate as you are able.

## 2022-05-22 NOTE — ED Triage Notes (Signed)
Patient brought in by [facility], reports fatigue, body aches, fever, congestion, and cough for about 3 days, worsening yesterday.

## 2022-07-28 ENCOUNTER — Ambulatory Visit
Admission: RE | Admit: 2022-07-28 | Discharge: 2022-07-28 | Disposition: A | Discharge status: Home / Self Care | Payer: PRIVATE HEALTH INSURANCE | Source: Ambulatory Visit

## 2022-07-28 VITALS — BP 117/79 | HR 87 | Temp 98.5°F | Resp 18 | Ht 62.0 in | Wt 135.0 lb

## 2022-07-28 DIAGNOSIS — M79672 Pain in left foot: Secondary | ICD-10-CM | POA: Diagnosis not present

## 2022-07-28 NOTE — Discharge Instructions (Signed)
Recommend elevation and ice application as we discussed.  Try to stay off your feet as much as possible.  Follow-up with podiatry if any symptoms persist or worsen.

## 2022-07-28 NOTE — ED Triage Notes (Signed)
Having pain walking standing bending my foot - Entered by patient  No injury known. Started about 10 days ago. No redness. No swelling known. Pain extends from behind toes on top up foot, ankle and leg.

## 2022-07-28 NOTE — ED Provider Notes (Signed)
[name]    CSN: [id number] Arrival date & time: 07/28/22  1038      History   Chief Complaint Chief Complaint  Patient presents with   Foot Pain    Left    HPI Carolyn Bryant is a 42 y.o. female.   Patient presents with left foot pain that started about 1.5 weeks ago.  Patient denies any injury.  Reports that she does do a lot of prolonged standing and walking at her workplace.  Reports the pain is present in the dorsal surface of the foot.  Denies numbness or tingling.  Patient is able to bear weight.  Patient has taken Tylenol for a few days as well with temporary improvement in pain.   Foot Pain    Past Medical History:  Diagnosis Date   Asthma     Patient Active Problem List   Diagnosis Date Noted   Pain in right knee 07/04/2021    Past Surgical History:  Procedure Laterality Date   TUBAL LIGATION      OB History     Gravida  3   Para  3   Term  3   Preterm      AB  0   Living  2      SAB  0   IAB      Ectopic      Multiple      Live Births               Home Medications    Prior to Admission medications   Medication Sig Start Date End Date Taking? Authorizing Provider  acetaminophen (TYLENOL) 500 MG tablet Take 1 tablet (500 mg total) by mouth every 6 (six) hours as needed. 12/22/20  Yes Fayrene Helper, PA-C  albuterol (VENTOLIN HFA) 108 (90 Base) MCG/ACT inhaler Inhale 1-2 puffs into the lungs every 6 (six) hours as needed for wheezing or shortness of breath. 02/22/22   Tomi Bamberger, PA-C  amoxicillin-clavulanate (AUGMENTIN) 875-125 MG tablet Take 1 tablet by mouth every 12 (twelve) hours. Patient not taking: Reported on 12/13/2021 09/14/21   Gustavus Bryant, FNP  azithromycin (ZITHROMAX) 250 MG tablet Take 1 tablet (250 mg total) by mouth daily. Take first 2 tablets together, then 1 every day until finished. 12/13/21   Tomi Bamberger, PA-C  ibuprofen (ADVIL) 200 MG tablet Take 200 mg by mouth every 6 (six) hours  as needed.    [provider]  lidocaine (XYLOCAINE) 2 % solution Use as directed 15 mLs in the mouth or throat every 4 (four) hours as needed for mouth pain. Swish/gargle and spit 05/29/21   Rhys Martini, PA-C  meloxicam (MOBIC) 15 MG tablet Take 1 tablet (15 mg total) by mouth daily. Patient not taking: Reported on 12/13/2021 07/04/21   Persons, [place]  ondansetron (ZOFRAN) 4 MG tablet Take 1 tablet (4 mg total) by mouth every 8 (eight) hours as needed for nausea or vomiting. 05/22/22   Darrick Grinder PA-C    Family History History reviewed. No pertinent family history.  Social History Social History   Tobacco Use   Smoking status: Every Day    Packs/day: .1    Types: Cigarettes   Smokeless tobacco: Never  Vaping Use   Vaping Use: Never used  Substance Use Topics   Alcohol use: Yes    Comment: occasional   Drug use: No     Allergies   Peanuts [peanut oil] and  Prednisone   Review of Systems Review of Systems Per HPI  Physical Exam Triage Vital Signs ED Triage Vitals  Enc Vitals Group     BP 07/28/22 1111 117/79     Pulse Rate 07/28/22 1111 87     Resp 07/28/22 1111 18     Temp 07/28/22 1111 98.5 F (36.9 C)     Temp Source 07/28/22 1111 Oral     SpO2 07/28/22 1111 97 %     Weight 07/28/22 1108 135 lb (61.2 kg)     Height 07/28/22 1108 5\' 2"  (1.575 m)     Head Circumference --      Peak Flow --      Pain Score 07/28/22 1108 8     Pain Loc --      Pain Edu? --      Excl. in GC? --    No data found.  Updated Vital Signs BP 117/79 (BP Location: Left Arm)   Pulse 87   Temp 98.5 F (36.9 C) (Oral)   Resp 18   Ht 5\' 2"  (1.575 m)   Wt 135 lb (61.2 kg)   LMP 07/27/2022 (Exact Date)   SpO2 97%   BMI 24.69 kg/m   Visual Acuity Right Eye Distance:   Left Eye Distance:   Bilateral Distance:    Right Eye Near:   Left Eye Near:    Bilateral Near:     Physical Exam Constitutional:      General: She is not in acute distress.     Appearance: Normal appearance. She is not toxic-appearing or diaphoretic.  HENT:     Head: Normocephalic and atraumatic.  Eyes:     Extraocular Movements: Extraocular movements intact.     Conjunctiva/sclera: Conjunctivae normal.  Pulmonary:     Effort: Pulmonary effort is normal.  Feet:     Comments: Patient has tenderness to palpation with very mild amount of swelling present to the dorsal surface of the left foot overlying the second and third metatarsals.  No obvious discoloration or warmth noted.  Patient can wiggle toes.  Capillary refill and pulses are intact.  No tenderness to toes, ankle, lower leg. Neurological:     General: No focal deficit present.     Mental Status: She is alert and oriented to person, place, and time. Mental status is at baseline.  Psychiatric:        Mood and Affect: Mood normal.        Behavior: Behavior normal.        Thought Content: Thought content normal.        Judgment: Judgment normal.      UC Treatments / Results  Labs (all labs ordered are listed, but only abnormal results are displayed) Labs Reviewed - No data to display  EKG   Radiology No results found.  Procedures Procedures (including critical care time)  Medications Ordered in UC Medications - No data to display  Initial Impression / Assessment and Plan / UC Course  I have reviewed the triage vital signs and the nursing notes.  Pertinent labs & imaging results that were available during my care of the patient were reviewed by me and considered in my medical decision making (see chart for details).     Given no injury, imaging was deferred.  No signs of septic joint or gout on physical exam.  No signs of cellulitis.  Suspect simple inflammation related to muscular etiology.  Advised elevation and ice application as well as  safe over-the-counter pain relievers.  She reports she is not allowed to take NSAIDs but that she can take Tylenol.  Advised following up with podiatry at  provided contact information if pain persists or worsens.  Patient verbalized understanding and was agreeable with plan. Final diagnoses:  Left foot pain     Discharge Instructions      Recommend elevation and ice application as we discussed.  Try to stay off your feet as much as possible.  Follow-up with podiatry if any symptoms persist or worsen.    ED Prescriptions   None    PDMP not reviewed this encounter.   Gustavus Bryant, [place] 07/28/22 1141

## 2022-09-11 ENCOUNTER — Ambulatory Visit
Admission: EM | Admit: 2022-09-11 | Discharge: 2022-09-11 | Disposition: A | Discharge status: Home / Self Care | Payer: PRIVATE HEALTH INSURANCE | Attending: Internal Medicine | Admitting: Internal Medicine

## 2022-09-11 ENCOUNTER — Ambulatory Visit: Payer: PRIVATE HEALTH INSURANCE

## 2022-09-11 DIAGNOSIS — M67441 Ganglion, right hand: Secondary | ICD-10-CM | POA: Diagnosis not present

## 2022-09-11 DIAGNOSIS — M79641 Pain in right hand: Secondary | ICD-10-CM | POA: Diagnosis not present

## 2022-09-11 NOTE — ED Triage Notes (Signed)
Pt presents to the office for R-pointer finger pain x 2 weeks. No injury known.

## 2022-09-11 NOTE — Discharge Instructions (Signed)
I suspect that you have a cyst of your hand as your x-ray was normal.  Please follow-up with hand specialty at provided contact information for further evaluation and management of this.

## 2022-09-11 NOTE — ED Provider Notes (Signed)
[name]    CSN: [id number] Arrival date & time: 09/11/22  0807      History   Chief Complaint Chief Complaint  Patient presents with   Hand Pain    HPI Carolyn Bryant is a 42 y.o. female.   Patient presents today with right second digit pain that started about 2 weeks ago.  Denies any injury to the area.  Reports that she has an area of swelling overlying the second MCP joint.  Denies history of chronic pain in this area.  Denies numbness or tingling.   Hand Pain    Past Medical History:  Diagnosis Date   Asthma     Patient Active Problem List   Diagnosis Date Noted   Pain in right knee 07/04/2021    Past Surgical History:  Procedure Laterality Date   TUBAL LIGATION      OB History     Gravida  3   Para  3   Term  3   Preterm      AB  0   Living  2      SAB  0   IAB      Ectopic      Multiple      Live Births               Home Medications    Prior to Admission medications   Medication Sig Start Date End Date Taking? Authorizing Provider  acetaminophen (TYLENOL) 500 MG tablet Take 1 tablet (500 mg total) by mouth every 6 (six) hours as needed. 12/22/20   Fayrene Helper, PA-C  albuterol (VENTOLIN HFA) 108 (90 Base) MCG/ACT inhaler Inhale 1-2 puffs into the lungs every 6 (six) hours as needed for wheezing or shortness of breath. 02/22/22   Tomi Bamberger, PA-C  amoxicillin-clavulanate (AUGMENTIN) 875-125 MG tablet Take 1 tablet by mouth every 12 (twelve) hours. Patient not taking: Reported on 12/13/2021 09/14/21   Gustavus Bryant, FNP  azithromycin (ZITHROMAX) 250 MG tablet Take 1 tablet (250 mg total) by mouth daily. Take first 2 tablets together, then 1 every day until finished. 12/13/21   Tomi Bamberger, PA-C  ibuprofen (ADVIL) 200 MG tablet Take 200 mg by mouth every 6 (six) hours as needed.    [provider]  lidocaine (XYLOCAINE) 2 % solution Use as directed 15 mLs in the mouth or throat every 4 (four)  hours as needed for mouth pain. Swish/gargle and spit 05/29/21   Rhys Martini, PA-C  meloxicam (MOBIC) 15 MG tablet Take 1 tablet (15 mg total) by mouth daily. Patient not taking: Reported on 12/13/2021 07/04/21   Persons, [place]  ondansetron (ZOFRAN) 4 MG tablet Take 1 tablet (4 mg total) by mouth every 8 (eight) hours as needed for nausea or vomiting. 05/22/22   Darrick Grinder, PA-C    Family History No family history on file.  Social History Social History   Tobacco Use   Smoking status: Every Day    Current packs/day: 0.10    Types: Cigarettes   Smokeless tobacco: Never  Vaping Use   Vaping status: Never Used  Substance Use Topics   Alcohol use: Yes    Comment: occasional   Drug use: No     Allergies   Peanuts [peanut oil] and Prednisone   Review of Systems Review of Systems Per HPI  Physical Exam Triage Vital Signs ED Triage Vitals  Encounter Vitals Group     BP  09/11/22 0821 124/76     Systolic BP Percentile --      Diastolic BP Percentile --      Pulse Rate 09/11/22 0821 76     Resp 09/11/22 0821 16     Temp 09/11/22 0821 97.9 F (36.6 C)     Temp Source 09/11/22 0821 Oral     SpO2 09/11/22 0821 98 %     Weight --      Height --      Head Circumference --      Peak Flow --      Pain Score 09/11/22 0823 5     Pain Loc --      Pain Education --      Exclude from Growth Chart --    No data found.  Updated Vital Signs BP 124/76 (BP Location: Left Arm)   Pulse 76   Temp 97.9 F (36.6 C) (Oral)   Resp 16   LMP 08/29/2022 (Approximate)   SpO2 98%   Visual Acuity Right Eye Distance:   Left Eye Distance:   Bilateral Distance:    Right Eye Near:   Left Eye Near:    Bilateral Near:     Physical Exam Constitutional:      General: She is not in acute distress.    Appearance: Normal appearance. She is not toxic-appearing or diaphoretic.  HENT:     Head: Normocephalic and atraumatic.  Eyes:     Extraocular Movements: Extraocular  movements intact.     Conjunctiva/sclera: Conjunctivae normal.  Pulmonary:     Effort: Pulmonary effort is normal.  Musculoskeletal:     Comments: Patient has approximately 1 cm area of swelling present to the medial portion of the right second MCP joint.  Tenderness to palpation to this area as well.  No area of warmth or discoloration.  Full range of motion of fingers present.  Capillary refill and pulses intact.  Neurological:     General: No focal deficit present.     Mental Status: She is alert and oriented to person, place, and time. Mental status is at baseline.  Psychiatric:        Mood and Affect: Mood normal.        Behavior: Behavior normal.        Thought Content: Thought content normal.        Judgment: Judgment normal.      UC Treatments / Results  Labs (all labs ordered are listed, but only abnormal results are displayed) Labs Reviewed - No data to display  EKG   Radiology DG Hand Complete Right  Result Date: 09/11/2022 CLINICAL DATA:  hand pain EXAM: RIGHT HAND - COMPLETE 3 VIEW COMPARISON:  None Available. FINDINGS: There is no evidence of fracture or dislocation. There is no evidence of arthropathy or other focal bone abnormality. Soft tissues are unremarkable. IMPRESSION: No acute abnormality Electronically Signed   By: Lorenza Cambridge M.D.   On: 09/11/2022 09:07    Procedures Procedures (including critical care time)  Medications Ordered in UC Medications - No data to display  Initial Impression / Assessment and Plan / UC Course  I have reviewed the triage vital signs and the nursing notes.  Pertinent labs & imaging results that were available during my care of the patient were reviewed by me and considered in my medical decision making (see chart for details).     X-ray of the hand was negative for any acute bony abnormality.  I suspect the  patient may have a cyst in that area given physical exam.  Given it is bothersome for her, recommended that she  hand specialty for further evaluation and management.  Provided patient with contact information for hand specialty.  Advised strict follow-up precautions.  Patient verbalized understanding and was agreeable with plan. Final Clinical Impressions(s) / UC Diagnoses   Final diagnoses:  Ganglion cyst of joint of finger of right hand     Discharge Instructions      I suspect that you have a cyst of your hand as your x-ray was normal.  Please follow-up with hand specialty at provided contact information for further evaluation and management of this.     ED Prescriptions   None    PDMP not reviewed this encounter.   Gustavus Bryant, [place] 09/11/22 [phone]

## 2022-09-16 DIAGNOSIS — M79644 Pain in right finger(s): Secondary | ICD-10-CM | POA: Insufficient documentation

## 2022-09-16 HISTORY — DX: Pain in right finger(s): M79.644

## 2022-09-25 DIAGNOSIS — M67449 Ganglion, unspecified hand: Secondary | ICD-10-CM | POA: Insufficient documentation

## 2022-10-21 ENCOUNTER — Ambulatory Visit
Admission: RE | Admit: 2022-10-21 | Discharge: 2022-10-21 | Disposition: A | Discharge status: Home / Self Care | Payer: PRIVATE HEALTH INSURANCE | Source: Ambulatory Visit | Attending: Physician Assistant | Admitting: Physician Assistant

## 2022-10-21 VITALS — BP 116/77 | HR 62 | Temp 98.0°F | Resp 16

## 2022-10-21 DIAGNOSIS — M79641 Pain in right hand: Secondary | ICD-10-CM

## 2022-10-21 MED ORDER — PREDNISONE 20 MG PO TABS: 40.0000 mg | ORAL_TABLET | Freq: Every day | ORAL | 0 refills | Status: AC

## 2022-10-21 NOTE — ED Triage Notes (Signed)
Pt states right hand pain.  Denies injury.  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  Has been taking Tylenol and using ice with no relief. Pts hand slightly swollen.

## 2022-10-31 NOTE — ED Provider Notes (Signed)
[name]    CSN: [id number] Arrival date & time: 10/21/22  1024      History   Chief Complaint Chief Complaint  Patient presents with   Hand Problem    Hand swollen a little pain - Entered by patient    HPI Carolyn Bryant is a 42 y.o. female.   Patient here today for evaluation of right hand pain.  He denies any injury to her hand but states she does do a lot of lifting at her job.  She notes she has been taking Tylenol and is used ice without any.  She denies any prior injury to the area.  She reports her hand feels somewhat swollen.  She denies any numbness or tingling.  Movement of hand makes pain worse.  The history is provided by the patient.    Past Medical History:  Diagnosis Date   Asthma     Patient Active Problem List   Diagnosis Date Noted   Pain in right knee 07/04/2021    Past Surgical History:  Procedure Laterality Date   TUBAL LIGATION      OB History     Gravida  3   Para  3   Term  3   Preterm      AB  0   Living  2      SAB  0   IAB      Ectopic      Multiple      Live Births               Home Medications    Prior to Admission medications   Medication Sig Start Date End Date Taking? Authorizing Provider  acetaminophen (TYLENOL) 500 MG tablet Take 1 tablet (500 mg total) by mouth every 6 (six) hours as needed. 12/22/20   Fayrene Helper, PA-C  albuterol (VENTOLIN HFA) 108 (90 Base) MCG/ACT inhaler Inhale 1-2 puffs into the lungs every 6 (six) hours as needed for wheezing or shortness of breath. 02/22/22   Tomi Bamberger, PA-C  amoxicillin-clavulanate (AUGMENTIN) 875-125 MG tablet Take 1 tablet by mouth every 12 (twelve) hours. Patient not taking: Reported on 12/13/2021 09/14/21   Gustavus Bryant, FNP  azithromycin (ZITHROMAX) 250 MG tablet Take 1 tablet (250 mg total) by mouth daily. Take first 2 tablets together, then 1 every day until finished. 12/13/21   Tomi Bamberger, PA-C  ibuprofen (ADVIL) 200 MG  tablet Take 200 mg by mouth every 6 (six) hours as needed.    [provider]  lidocaine (XYLOCAINE) 2 % solution Use as directed 15 mLs in the mouth or throat every 4 (four) hours as needed for mouth pain. Swish/gargle and spit 05/29/21   Rhys Martini, PA-C  meloxicam (MOBIC) 15 MG tablet Take 1 tablet (15 mg total) by mouth daily. Patient not taking: Reported on 12/13/2021 07/04/21   Persons, [place], PA  ondansetron (ZOFRAN) 4 MG tablet Take 1 tablet (4 mg total) by mouth every 8 (eight) hours as needed for nausea or vomiting. 05/22/22   Darrick Grinder PA-C    Family History History reviewed. No pertinent family history.  Social History Social History   Tobacco Use   Smoking status: Every Day    Current packs/day: 0.10    Types: Cigarettes   Smokeless tobacco: Never  Vaping Use   Vaping status: Never Used  Substance Use Topics   Alcohol use: Yes    Comment: occasional   Drug  use: No     Allergies   Peanuts [peanut oil]   Review of Systems Review of Systems  Constitutional:  Negative for chills and fever.  Eyes:  Negative for discharge and redness.  Gastrointestinal:  Negative for abdominal pain, nausea and vomiting.  Musculoskeletal:  Positive for arthralgias. Negative for joint swelling.  Neurological:  Negative for numbness.     Physical Exam Triage Vital Signs ED Triage Vitals  Encounter Vitals Group     BP 10/21/22 1043 116/77     Systolic BP Percentile --      Diastolic BP Percentile --      Pulse Rate 10/21/22 1043 62     Resp 10/21/22 1043 16     Temp 10/21/22 1043 98 F (36.7 C)     Temp Source 10/21/22 1043 Oral     SpO2 10/21/22 1043 100 %     Weight --      Height --      Head Circumference --      Peak Flow --      Pain Score 10/21/22 1044 7     Pain Loc --      Pain Education --      Exclude from Growth Chart --    No data found.  Updated Vital Signs BP 116/77 (BP Location: Left Arm)   Pulse 62   Temp 98 F (36.7 C)  (Oral)   Resp 16   LMP 10/17/2022 (Exact Date)   SpO2 100%      Physical Exam Vitals and nursing note reviewed.  Constitutional:      General: She is not in acute distress.    Appearance: Normal appearance. She is not ill-appearing.  HENT:     Head: Normocephalic and atraumatic.  Eyes:     Conjunctiva/sclera: Conjunctivae normal.  Cardiovascular:     Rate and Rhythm: Normal rate.  Pulmonary:     Effort: Pulmonary effort is normal. No respiratory distress.  Musculoskeletal:     Comments: Full range of motion of right hand and fingers  Skin:    Comments: No erythema to right hand  Neurological:     Mental Status: She is alert.     Comments: Gross sensation intact to right fingers distally  Psychiatric:        Mood and Affect: Mood normal.        Behavior: Behavior normal.        Thought Content: Thought content normal.      UC Treatments / Results  Labs (all labs ordered are listed, but only abnormal results are displayed) Labs Reviewed - No data to display  EKG   Radiology No results found.  Procedures Procedures (including critical care time)  Medications Ordered in UC Medications - No data to display  Initial Impression / Assessment and Plan / UC Course  I have reviewed the triage vital signs and the nursing notes.  Pertinent labs & imaging results that were available during my care of the patient were reviewed by me and considered in my medical decision making (see chart for details).    Unclear etiology of symptoms but seems to be inflammatory in nature.  Will treat with steroid burst and recommended follow-up if no gradual improvement or with any further concerns  Final Clinical Impressions(s) / UC Diagnoses   Final diagnoses:  Right hand pain   Discharge Instructions   None    ED Prescriptions     Medication Sig Dispense Auth. Provider  predniSONE (DELTASONE) 20 MG tablet Take 2 tablets (40 mg total) by mouth daily with breakfast for 5  days. 10 tablet Tomi Bamberger, PA-C      PDMP not reviewed this encounter.   Tomi Bamberger, PA-C 10/31/22 [phone]

## 2022-11-10 ENCOUNTER — Ambulatory Visit
Admission: EM | Admit: 2022-11-10 | Discharge: 2022-11-10 | Disposition: A | Discharge status: Home / Self Care | Payer: PRIVATE HEALTH INSURANCE | Attending: Physician Assistant | Admitting: Physician Assistant

## 2022-11-10 DIAGNOSIS — H9201 Otalgia, right ear: Secondary | ICD-10-CM | POA: Diagnosis not present

## 2022-11-10 MED ORDER — AMOXICILLIN 500 MG PO CAPS: 500.0000 mg | ORAL_CAPSULE | Freq: Three times a day (TID) | ORAL | 0 refills | Status: DC

## 2022-11-10 NOTE — ED Provider Notes (Signed)
[name]    CSN: [id number] Arrival date & time: 11/10/22  0802      History   Chief Complaint Chief Complaint  Patient presents with   Otalgia    HPI Carolyn Bryant is a 42 y.o. female.   Patient here today for evaluation of right ear pain that started 3 days ago and is worsening with time.  She reports that pain seems to be worse when she is bending over and she has had some difficulty hearing.  She has tried Tylenol without resolution.  She denies any cough but has had some sinus congestion.  She denies any fever.   The history is provided by the patient.    Past Medical History:  Diagnosis Date   Asthma     Patient Active Problem List   Diagnosis Date Noted   Pain in right knee 07/04/2021    Past Surgical History:  Procedure Laterality Date   TUBAL LIGATION      OB History     Gravida  3   Para  3   Term  3   Preterm      AB  0   Living  2      SAB  0   IAB      Ectopic      Multiple      Live Births               Home Medications    Prior to Admission medications   Medication Sig Start Date End Date Taking? Authorizing Provider  amoxicillin (AMOXIL) 500 MG capsule Take 1 capsule (500 mg total) by mouth 3 (three) times daily. 11/10/22  Yes Tomi Bamberger, PA-C  acetaminophen (TYLENOL) 500 MG tablet Take 1 tablet (500 mg total) by mouth every 6 (six) hours as needed. 12/22/20   Fayrene Helper, PA-C  albuterol (VENTOLIN HFA) 108 (90 Base) MCG/ACT inhaler Inhale 1-2 puffs into the lungs every 6 (six) hours as needed for wheezing or shortness of breath. 02/22/22   Tomi Bamberger, PA-C  ibuprofen (ADVIL) 200 MG tablet Take 200 mg by mouth every 6 (six) hours as needed.    [provider]  lidocaine (XYLOCAINE) 2 % solution Use as directed 15 mLs in the mouth or throat every 4 (four) hours as needed for mouth pain. Swish/gargle and spit 05/29/21   Rhys Martini, PA-C  meloxicam (MOBIC) 15 MG tablet Take 1 tablet  (15 mg total) by mouth daily. Patient not taking: Reported on 12/13/2021 07/04/21   Persons, [place], PA  ondansetron (ZOFRAN) 4 MG tablet Take 1 tablet (4 mg total) by mouth every 8 (eight) hours as needed for nausea or vomiting. 05/22/22   Darrick Grinder, PA-C    Family History No family history on file.  Social History Social History   Tobacco Use   Smoking status: Every Day    Current packs/day: 0.10    Types: Cigarettes   Smokeless tobacco: Never  Vaping Use   Vaping status: Never Used  Substance Use Topics   Alcohol use: Yes    Comment: occasional   Drug use: No     Allergies   Peanuts [peanut oil]   Review of Systems Review of Systems  Constitutional:  Negative for chills and fever.  HENT:  Positive for congestion and ear pain.   Eyes:  Negative for discharge and redness.  Respiratory:  Negative for cough and shortness of breath.   Gastrointestinal:  Negative for nausea and vomiting.     Physical Exam Triage Vital Signs ED Triage Vitals  Encounter Vitals Group     BP      Systolic BP Percentile      Diastolic BP Percentile      Pulse      Resp      Temp      Temp src      SpO2      Weight      Height      Head Circumference      Peak Flow      Pain Score      Pain Loc      Pain Education      Exclude from Growth Chart    No data found.  Updated Vital Signs BP 120/80 (BP Location: Left Arm)   Pulse 74   Temp 98 F (36.7 C) (Oral)   Ht 5\' 2"  (1.575 m)   Wt 129 lb (58.5 kg)   LMP 11/03/2022   SpO2 100%   BMI 23.59 kg/m       Physical Exam Vitals and nursing note reviewed.  Constitutional:      General: She is not in acute distress.    Appearance: Normal appearance. She is not ill-appearing.  HENT:     Head: Normocephalic and atraumatic.     Right Ear: Ear canal normal. There is impacted cerumen.     Left Ear: Tympanic membrane normal.     Ears:     Comments: Small amount of cerumen blocking visualization of right TM    Nose:  Congestion present.     Mouth/Throat:     Mouth: Mucous membranes are moist.  Eyes:     Conjunctiva/sclera: Conjunctivae normal.  Cardiovascular:     Rate and Rhythm: Normal rate.  Pulmonary:     Effort: Pulmonary effort is normal. No respiratory distress.  Neurological:     Mental Status: She is alert.  Psychiatric:        Mood and Affect: Mood normal.        Behavior: Behavior normal.        Thought Content: Thought content normal.      UC Treatments / Results  Labs (all labs ordered are listed, but only abnormal results are displayed) Labs Reviewed - No data to display  EKG   Radiology No results found.  Procedures Procedures (including critical care time)  Medications Ordered in UC Medications - No data to display  Initial Impression / Assessment and Plan / UC Course  I have reviewed the triage vital signs and the nursing notes.  Pertinent labs & imaging results that were available during my care of the patient were reviewed by me and considered in my medical decision making (see chart for details).     Will treat to cover otitis media given inability to visualize right TM.  Cerumen irrigation deferred today given patient's report of cold air causing increased pain, suspect that irrigation would be uncomfortable.  Encouraged follow-up if no improvement or with any further concerns.   Final Clinical Impressions(s) / UC Diagnoses   Final diagnoses:  Acute otalgia, right   Discharge Instructions   None    ED Prescriptions     Medication Sig Dispense Auth. Provider   amoxicillin (AMOXIL) 500 MG capsule Take 1 capsule (500 mg total) by mouth 3 (three) times daily. 21 capsule Tomi Bamberger, PA-C      PDMP not reviewed this encounter.  Tomi Bamberger, PA-C 11/10/22 [phone]

## 2022-11-10 NOTE — ED Triage Notes (Signed)
Patient present with right ear pain, worse when bending, states it hurts and difficult hearing x 3 days. Treating with Tylenol and put cotton swab inside ear.

## 2022-12-12 ENCOUNTER — Ambulatory Visit
Admission: EM | Admit: 2022-12-12 | Discharge: 2022-12-12 | Disposition: A | Discharge status: Other Acute Inpatient Hospital | Payer: PRIVATE HEALTH INSURANCE

## 2022-12-12 DIAGNOSIS — R509 Fever, unspecified: Secondary | ICD-10-CM

## 2022-12-12 DIAGNOSIS — R531 Weakness: Secondary | ICD-10-CM | POA: Diagnosis not present

## 2022-12-12 DIAGNOSIS — R059 Cough, unspecified: Secondary | ICD-10-CM | POA: Diagnosis not present

## 2022-12-12 MED ORDER — ACETAMINOPHEN 325 MG PO TABS
650.0000 mg | ORAL_TABLET | Freq: Once | ORAL | Status: AC
  Administered 2022-12-12: 650 mg via ORAL

## 2022-12-12 NOTE — ED Triage Notes (Signed)
"  I just started Coughing (randomly) last night at work but then got to wheezing with sob". "My body hurts a lot with a lot of coughing still". No fever known "but hot and cold a lot since yesterday".

## 2022-12-12 NOTE — ED Provider Notes (Signed)
[name]    CSN: [id number] Arrival date & time: 12/12/22  0816      History   Chief Complaint Chief Complaint  Patient presents with   Cough   Chills   Fever    HPI Carolyn Bryant is a 42 y.o. female.   Patient here today for evaluation of cough that started last night.  She states she had some wheezing and shortness of breath as well.  She notes today she has significant body aches and has been coughing a significant amount.  She denies known fever however does have fever in office.  She states she has had off-and-on chills since yesterday.  Patient required to use a wheelchair to be transported to exam room due to significant leg pain and bodyaches.   Cough Associated symptoms: chills, fever, myalgias, shortness of breath and wheezing   Associated symptoms: no ear pain, no eye discharge and no sore throat   Fever Associated symptoms: chills, congestion, cough and myalgias   Associated symptoms: no diarrhea, no ear pain, no nausea, no sore throat and no vomiting     Past Medical History:  Diagnosis Date   Asthma     Patient Active Problem List   Diagnosis Date Noted   Digital mucinous cyst of finger 09/25/2022   Pain in finger of right hand 09/16/2022   Pain in right knee 07/04/2021    Past Surgical History:  Procedure Laterality Date   TUBAL LIGATION      OB History     Gravida  3   Para  3   Term  3   Preterm      AB  0   Living  2      SAB  0   IAB      Ectopic      Multiple      Live Births               Home Medications    Prior to Admission medications   Medication Sig Start Date End Date Taking? Authorizing Provider  albuterol (VENTOLIN HFA) 108 (90 Base) MCG/ACT inhaler Inhale 1-2 puffs into the lungs every 6 (six) hours as needed for wheezing or shortness of breath. 02/22/22  Yes Tomi Bamberger, PA-C  FEROSUL 325 (65 Fe) MG tablet Take 325 mg by mouth daily. 10/16/22  Yes [provider]   penicillin v potassium (VEETID) 500 MG tablet Take 500 mg by mouth as directed. 01/25/17  Yes [provider]  traMADol (ULTRAM) 50 MG tablet Take 50 mg by mouth as directed. 01/25/17  Yes [provider]  acetaminophen (TYLENOL) 500 MG tablet Take 1 tablet (500 mg total) by mouth every 6 (six) hours as needed. 12/22/20   Fayrene Helper, PA-C  amoxicillin (AMOXIL) 500 MG capsule Take 1 capsule (500 mg total) by mouth 3 (three) times daily. 11/10/22   Tomi Bamberger, PA-C  ibuprofen (ADVIL) 200 MG tablet Take 200 mg by mouth every 6 (six) hours as needed.    [provider]  lidocaine (XYLOCAINE) 2 % solution Use as directed 15 mLs in the mouth or throat every 4 (four) hours as needed for mouth pain. Swish/gargle and spit 05/29/21   Rhys Martini, PA-C  meloxicam (MOBIC) 15 MG tablet Take 1 tablet (15 mg total) by mouth daily. Patient not taking: Reported on 12/13/2021 07/04/21   Persons, [place], PA  ondansetron (ZOFRAN) 4 MG tablet Take 1 tablet (4 mg total)  by mouth every 8 (eight) hours as needed for nausea or vomiting. 05/22/22   Darrick Grinder PA-C    Family History History reviewed. No pertinent family history.  Social History Social History   Tobacco Use   Smoking status: Every Day    Current packs/day: 0.10    Types: Cigarettes   Smokeless tobacco: Never  Vaping Use   Vaping status: Never Used  Substance Use Topics   Alcohol use: Yes    Comment: occasional   Drug use: No     Allergies   Peanuts [peanut oil] and Prednisone   Review of Systems Review of Systems  Constitutional:  Positive for chills and fever.  HENT:  Positive for congestion. Negative for ear pain and sore throat.   Eyes:  Negative for discharge and redness.  Respiratory:  Positive for cough, shortness of breath and wheezing.   Gastrointestinal:  Negative for abdominal pain, diarrhea, nausea and vomiting.  Musculoskeletal:  Positive for myalgias.     Physical Exam Triage  Vital Signs ED Triage Vitals  Encounter Vitals Group     BP 12/12/22 0828 107/70     Systolic BP Percentile --      Diastolic BP Percentile --      Pulse Rate 12/12/22 0828 (!) 110     Resp 12/12/22 0828 18     Temp 12/12/22 0828 100.3 F (37.9 C)     Temp Source 12/12/22 0828 Oral     SpO2 12/12/22 0828 97 %     Weight 12/12/22 0826 135 lb (61.2 kg)     Height 12/12/22 0826 5\' 2"  (1.575 m)     Head Circumference --      Peak Flow --      Pain Score 12/12/22 0826 7     Pain Loc --      Pain Education --      Exclude from Growth Chart --    No data found.  Updated Vital Signs BP 107/70 (BP Location: Right Arm)   Pulse (!) 110   Temp 100.3 F (37.9 C) (Oral)   Resp 18   Ht 5\' 2"  (1.575 m)   Wt 135 lb (61.2 kg)   LMP 11/23/2022 (Approximate)   SpO2 97%   BMI 24.69 kg/m   Visual Acuity Right Eye Distance:   Left Eye Distance:   Bilateral Distance:    Right Eye Near:   Left Eye Near:    Bilateral Near:     Physical Exam Vitals and nursing note reviewed.  Constitutional:      General: She is in acute distress.     Appearance: She is ill-appearing.     Comments: Appears to not feel well  HENT:     Head: Normocephalic and atraumatic.     Nose: Congestion present.     Mouth/Throat:     Mouth: Mucous membranes are moist.     Pharynx: No oropharyngeal exudate or posterior oropharyngeal erythema.  Eyes:     Conjunctiva/sclera: Conjunctivae normal.  Cardiovascular:     Rate and Rhythm: Normal rate and regular rhythm.     Heart sounds: Normal heart sounds. No murmur heard. Pulmonary:     Effort: Pulmonary effort is normal. No respiratory distress.     Breath sounds: Wheezing and rhonchi present. No rales.     Comments: Scattered wheezes and rhonchi Skin:    General: Skin is warm and dry.  Neurological:     Mental Status: She is alert.  Psychiatric:  Mood and Affect: Mood normal.        Thought Content: Thought content normal.      UC Treatments /  Results  Labs (all labs ordered are listed, but only abnormal results are displayed) Labs Reviewed - No data to display  EKG   Radiology No results found.  Procedures Procedures (including critical care time)  Medications Ordered in UC Medications  acetaminophen (TYLENOL) tablet 650 mg (650 mg Oral Given 12/12/22 0840)    Initial Impression / Assessment and Plan / UC Course  I have reviewed the triage vital signs and the nursing notes.  Pertinent labs & imaging results that were available during my care of the patient were reviewed by me and considered in my medical decision making (see chart for details).    Given fever, generalized weakness, adventitious lung sounds recommended further evaluation in the emergency room for stat labs and imaging.  At home COVID and flu test was negative.  Patient is agreeable to be seen in the emergency room and friend with her will transport via POV.  Final Clinical Impressions(s) / UC Diagnoses   Final diagnoses:  Fever, unspecified  Generalized weakness  Cough, unspecified type   Discharge Instructions   None    ED Prescriptions   None    PDMP not reviewed this encounter.   Tomi Bamberger, PA-C 12/12/22 [phone]

## 2022-12-12 NOTE — ED Notes (Signed)
Patient is being discharged from the Urgent Care and sent to the Emergency Department via POV . Per RM, patient is in need of higher level of care due to chills. Patient is aware and verbalizes understanding of plan of care.  Vitals:   12/12/22 0828  BP: 107/70  Pulse: (!) 110  Resp: 18  Temp: 100.3 F (37.9 C)  SpO2: 97%

## 2022-12-13 ENCOUNTER — Encounter (HOSPITAL_COMMUNITY): Payer: Self-pay

## 2022-12-13 ENCOUNTER — Emergency Department (HOSPITAL_COMMUNITY): Payer: PRIVATE HEALTH INSURANCE

## 2022-12-13 ENCOUNTER — Other Ambulatory Visit: Payer: Self-pay

## 2022-12-13 ENCOUNTER — Emergency Department (HOSPITAL_COMMUNITY)
Admission: EM | Admit: 2022-12-13 | Discharge: 2022-12-13 | Disposition: A | Discharge status: Home / Self Care | Payer: PRIVATE HEALTH INSURANCE | Attending: Emergency Medicine | Admitting: Emergency Medicine

## 2022-12-13 DIAGNOSIS — J988 Other specified respiratory disorders: Secondary | ICD-10-CM | POA: Diagnosis not present

## 2022-12-13 DIAGNOSIS — E876 Hypokalemia: Secondary | ICD-10-CM | POA: Diagnosis not present

## 2022-12-13 DIAGNOSIS — B9789 Other viral agents as the cause of diseases classified elsewhere: Secondary | ICD-10-CM | POA: Diagnosis not present

## 2022-12-13 DIAGNOSIS — J4541 Moderate persistent asthma with (acute) exacerbation: Secondary | ICD-10-CM | POA: Diagnosis not present

## 2022-12-13 DIAGNOSIS — R000 Tachycardia, unspecified: Secondary | ICD-10-CM | POA: Diagnosis not present

## 2022-12-13 DIAGNOSIS — R0602 Shortness of breath: Secondary | ICD-10-CM | POA: Diagnosis present

## 2022-12-13 LAB — CBC WITH DIFFERENTIAL/PLATELET
Abs Immature Granulocytes: 0.02 10*3/uL (ref 0.00–0.07)
Basophils Absolute: 0 10*3/uL (ref 0.0–0.1)
Basophils Relative: 0 %
Eosinophils Absolute: 0 10*3/uL (ref 0.0–0.5)
Eosinophils Relative: 1 %
HCT: 30.1 % — ABNORMAL LOW (ref 36.0–46.0)
Hemoglobin: 9.4 g/dL — ABNORMAL LOW (ref 12.0–15.0)
Immature Granulocytes: 0 %
Lymphocytes Relative: 22 %
Lymphs Abs: 1.3 10*3/uL (ref 0.7–4.0)
MCH: 24.4 pg — ABNORMAL LOW (ref 26.0–34.0)
MCHC: 31.2 g/dL (ref 30.0–36.0)
MCV: 78.2 fL — ABNORMAL LOW (ref 80.0–100.0)
Monocytes Absolute: 0.4 10*3/uL (ref 0.1–1.0)
Monocytes Relative: 7 %
Neutro Abs: 4.1 10*3/uL (ref 1.7–7.7)
Neutrophils Relative %: 70 %
Platelets: 346 10*3/uL (ref 150–400)
RBC: 3.85 MIL/uL — ABNORMAL LOW (ref 3.87–5.11)
RDW: 19.1 % — ABNORMAL HIGH (ref 11.5–15.5)
WBC: 5.9 10*3/uL (ref 4.0–10.5)
nRBC: 0 % (ref 0.0–0.2)

## 2022-12-13 LAB — BASIC METABOLIC PANEL
Anion gap: 10 (ref 5–15)
BUN: <5 mg/dL — ABNORMAL LOW (ref 6–20)
CO2: 21 mmol/L — ABNORMAL LOW (ref 22–32)
Calcium: 8.9 mg/dL (ref 8.9–10.3)
Chloride: 103 mmol/L (ref 98–111)
Creatinine, Ser: 0.8 mg/dL (ref 0.44–1.00)
GFR, Estimated: >60 mL/min (ref >60)
Glucose, Bld: 118 mg/dL — ABNORMAL HIGH (ref 70–99)
Potassium: 3.3 mmol/L — ABNORMAL LOW (ref 3.5–5.1)
Sodium: 134 mmol/L — ABNORMAL LOW (ref 135–145)

## 2022-12-13 LAB — D-DIMER, QUANTITATIVE: D-Dimer, Quant: 0.37 ug{FEU}/mL (ref 0.00–0.50)

## 2022-12-13 MED ORDER — IPRATROPIUM-ALBUTEROL 0.5-2.5 (3) MG/3ML IN SOLN
3.0000 mL | Freq: Once | RESPIRATORY_TRACT | Status: AC
  Administered 2022-12-13: 3 mL via RESPIRATORY_TRACT
  Filled 2022-12-13: qty 3

## 2022-12-13 MED ORDER — BENZONATATE 100 MG PO CAPS: 100.0000 mg | ORAL_CAPSULE | Freq: Three times a day (TID) | ORAL | 0 refills | Status: DC | PRN

## 2022-12-13 MED ORDER — PREDNISONE 10 MG PO TABS: 40.0000 mg | ORAL_TABLET | Freq: Every day | ORAL | 0 refills | Status: DC

## 2022-12-13 MED ORDER — ACETAMINOPHEN 325 MG PO TABS
650.0000 mg | ORAL_TABLET | Freq: Once | ORAL | Status: AC
Start: 2022-12-13 — End: 2022-12-13
  Administered 2022-12-13: 650 mg via ORAL
  Filled 2022-12-13: qty 2

## 2022-12-13 MED ORDER — POTASSIUM CHLORIDE CRYS ER 20 MEQ PO TBCR
20.0000 meq | EXTENDED_RELEASE_TABLET | Freq: Once | ORAL | Status: AC
  Administered 2022-12-13: 20 meq via ORAL
  Filled 2022-12-13: qty 1

## 2022-12-13 MED ORDER — METHYLPREDNISOLONE SODIUM SUCC 125 MG IJ SOLR
125.0000 mg | Freq: Once | INTRAMUSCULAR | Status: AC
  Administered 2022-12-13: 125 mg via INTRAVENOUS
  Filled 2022-12-13: qty 2

## 2022-12-13 MED ORDER — ALBUTEROL SULFATE HFA 108 (90 BASE) MCG/ACT IN AERS: 2.0000 | INHALATION_SPRAY | Freq: Four times a day (QID) | RESPIRATORY_TRACT | 0 refills | Status: AC | PRN

## 2022-12-13 NOTE — ED Provider Notes (Signed)
[place] EMERGENCY DEPARTMENT AT [facility] Provider Note   CSN: [id number] Arrival date & time: 12/13/22  0400     History  Chief Complaint  Patient presents with   Shortness of Breath    Carolyn Bryant is a 42 y.o. female.  The history is provided by the patient and medical records.  Shortness of Breath Carolyn Bryant is a 42 y.o. female who presents to the Emergency Department complaining of shortness of breath.  She presents to the emergency department for evaluation of shortness of breath, fever, chills with temperature to 103 that started 2 days ago.  She went to urgent care yesterday and tested negative for flu and COVID.  She states that she was directed to the emergency department for further evaluation.  She has a history of asthma and uses an inhaler at home, it seems to help some but does not completely relieve her symptoms.  Chest pain is described as a soreness when she coughs.  No leg swelling or pain.  No vomiting.  No abdominal pain.   No ocp.  No hx/o DVT/PE.       Home Medications Prior to Admission medications   Medication Sig Start Date End Date Taking? Authorizing Provider  albuterol (VENTOLIN HFA) 108 (90 Base) MCG/ACT inhaler Inhale 2 puffs into the lungs every 6 (six) hours as needed for wheezing or shortness of breath. 12/13/22  Yes Tilden Fossa, MD  benzonatate (TESSALON) 100 MG capsule Take 1 capsule (100 mg total) by mouth 3 (three) times daily as needed for cough. 12/13/22  Yes Tilden Fossa, MD  predniSONE (DELTASONE) 10 MG tablet Take 4 tablets (40 mg total) by mouth daily. 12/13/22  Yes Tilden Fossa, MD  acetaminophen (TYLENOL) 500 MG tablet Take 1 tablet (500 mg total) by mouth every 6 (six) hours as needed. 12/22/20   Fayrene Helper, PA-C  amoxicillin (AMOXIL) 500 MG capsule Take 1 capsule (500 mg total) by mouth 3 (three) times daily. 11/10/22   Tomi Bamberger, PA-C  FEROSUL 325 (65 Fe) MG tablet Take 325 mg by mouth daily.  10/16/22   [provider]  ibuprofen (ADVIL) 200 MG tablet Take 200 mg by mouth every 6 (six) hours as needed.    [provider]  lidocaine (XYLOCAINE) 2 % solution Use as directed 15 mLs in the mouth or throat every 4 (four) hours as needed for mouth pain. Swish/gargle and spit 05/29/21   Rhys Martini, PA-C  meloxicam (MOBIC) 15 MG tablet Take 1 tablet (15 mg total) by mouth daily. Patient not taking: Reported on 12/13/2021 07/04/21   Persons, [place], PA  ondansetron (ZOFRAN) 4 MG tablet Take 1 tablet (4 mg total) by mouth every 8 (eight) hours as needed for nausea or vomiting. 05/22/22   Barrie Dunker B, PA-C  penicillin v potassium (VEETID) 500 MG tablet Take 500 mg by mouth as directed. 01/25/17   [provider]  traMADol (ULTRAM) 50 MG tablet Take 50 mg by mouth as directed. 01/25/17   [provider]      Allergies    Peanuts [peanut oil] and Prednisone    Review of Systems   Review of Systems  Respiratory:  Positive for shortness of breath.   All other systems reviewed and are negative.   Physical Exam Updated Vital Signs BP 114/84   Pulse 91   Temp 99.9 F (37.7 C) (Oral)   Resp (!) 26   Ht 5\' 2"  (1.575 m)  Wt 61.2 kg   LMP 11/23/2022 (Approximate)   SpO2 95%   BMI 24.69 kg/m  Physical Exam Vitals and nursing note reviewed.  Constitutional:      Appearance: She is well-developed.  HENT:     Head: Normocephalic and atraumatic.  Cardiovascular:     Rate and Rhythm: Regular rhythm. Tachycardia present.     Heart sounds: No murmur heard. Pulmonary:     Effort: Pulmonary effort is normal. No respiratory distress.     Comments: Inspiratory and expiratory wheezes bilaterally Abdominal:     Palpations: Abdomen is soft.     Tenderness: There is no abdominal tenderness. There is no guarding or rebound.  Musculoskeletal:        General: No swelling or tenderness.  Skin:    General: Skin is warm and dry.  Neurological:     Mental  Status: She is alert and oriented to person, place, and time.  Psychiatric:        Behavior: Behavior normal.     ED Results / Procedures / Treatments   Labs (all labs ordered are listed, but only abnormal results are displayed) Labs Reviewed  CBC WITH DIFFERENTIAL/PLATELET - Abnormal; Notable for the following components:      Result Value   RBC 3.85 (*)    Hemoglobin 9.4 (*)    HCT 30.1 (*)    MCV 78.2 (*)    MCH 24.4 (*)    RDW 19.1 (*)    All other components within normal limits  BASIC METABOLIC PANEL - Abnormal; Notable for the following components:   Sodium 134 (*)    Potassium 3.3 (*)    CO2 21 (*)    Glucose, Bld 118 (*)    BUN <5 (*)    All other components within normal limits  D-DIMER, QUANTITATIVE    EKG EKG Interpretation Date/Time:  Sunday December 13 2022 04:15:37 EST Ventricular Rate:  114 PR Interval:  145 QRS Duration:  85 QT Interval:  314 QTC Calculation: 433 R Axis:   62  Text Interpretation: Sinus tachycardia Right atrial enlargement Abnormal R-wave progression, early transition Confirmed by Tilden Fossa [phone]) on 12/13/2022 4:24:30 AM  Radiology DG Chest Port 1 View  Result Date: 12/13/2022 CLINICAL DATA:  42 year old female with shortness of breath and cough. Low-grade fever. EXAM: PORTABLE CHEST 1 VIEW COMPARISON:  Chest radiographs 05/22/2022 and earlier. FINDINGS: Portable AP semi upright view at 0442 hours. Lung volumes and mediastinal contours remain normal. Visualized tracheal air column is within normal limits. Allowing for portable technique the lungs are clear. No pneumothorax or pleural effusion. Negative visible bowel gas and osseous structures. IMPRESSION: Negative portable chest. Electronically Signed   By: Odessa Fleming M.D.   On: 12/13/2022 05:01    Procedures Procedures    Medications Ordered in ED Medications  ipratropium-albuterol (DUONEB) 0.5-2.5 (3) MG/3ML nebulizer solution 3 mL (3 mLs Nebulization Given 12/13/22 0538)   methylPREDNISolone sodium succinate (SOLU-MEDROL) 125 mg/2 mL injection 125 mg (125 mg Intravenous Given 12/13/22 0538)  acetaminophen (TYLENOL) tablet 650 mg (650 mg Oral Given 12/13/22 0638)  potassium chloride SA (KLOR-CON M) CR tablet 20 mEq (20 mEq Oral Given 12/13/22 6045)    ED Course/ Medical Decision Making/ A&P                                 Medical Decision Making Amount and/or Complexity of Data Reviewed Labs: ordered. Radiology: ordered.  Risk OTC drugs. Prescription drug management.   Patient with history of asthma here for evaluation of shortness of breath, fever.  She does have wheezing on examination without respiratory distress.  Chest x-ray is negative for acute abnormality.  She is treated with a nebulizer treatment in the emergency department with resolution of her wheezing.  No hypoxia.  She is low risk for PE, D-dimer is negative.  Current clinical picture is not consistent with PE.  No evidence of sepsis, picture is not consistent with CHF.  She feels improved after treatment in the department.  Plan to discharge home with treatment for asthma exacerbation, likely secondary to viral respiratory infection.  Discussed return precautions.        Final Clinical Impression(s) / ED Diagnoses Final diagnoses:  Moderate persistent asthma with exacerbation  Viral respiratory infection  Hypokalemia    Rx / DC Orders ED Discharge Orders          Ordered    predniSONE (DELTASONE) 10 MG tablet  Daily        12/13/22 0642    benzonatate (TESSALON) 100 MG capsule  3 times daily PRN        12/13/22 0642    albuterol (VENTOLIN HFA) 108 (90 Base) MCG/ACT inhaler  Every 6 hours PRN        12/13/22 0865              Tilden Fossa, MD 12/13/22 [phone]

## 2022-12-13 NOTE — ED Notes (Addendum)
4:38 AM  Patient reports SOB with coughing and fevers of 103.0 x 2 days progressively worsening. Patient went to UC on yesterday morning and had a  neg Covid/Flu/RSV swab. Denies nausea and vomiting. She endorses a dry, non-productive cough that does cause midsternal, non-radiating CP when coughing occurs. She also reports having dizziness and HA intermittently with generalized body aches. She is alert and oriented x 4. She has equal rise and fall of the chest wall with clear lung sounds. Currently rating pain 8/10 generalized. Last had 650 mg Tylenol x 2100 last night, with no relief of fever or pain. Pending ER provider assessment and orders. Pending xray. Bed in lowest position. Call light in reach.   4:45 AM   Xray present at bedside.

## 2022-12-13 NOTE — ED Triage Notes (Signed)
Patient arrives with shortness of breath. Was seen at the urgent care and tested negative for COVID and flu. However patient states she has had a fever and has had a cough. Temp in triage was 100.1.

## 2023-02-18 ENCOUNTER — Ambulatory Visit
Admission: EM | Admit: 2023-02-18 | Discharge: 2023-02-18 | Disposition: A | Discharge status: Home / Self Care | Payer: PRIVATE HEALTH INSURANCE | Attending: Family Medicine | Admitting: Family Medicine

## 2023-02-18 ENCOUNTER — Telehealth: Payer: Self-pay | Admitting: Family Medicine

## 2023-02-18 ENCOUNTER — Ambulatory Visit (INDEPENDENT_AMBULATORY_CARE_PROVIDER_SITE_OTHER): Payer: PRIVATE HEALTH INSURANCE

## 2023-02-18 DIAGNOSIS — R079 Chest pain, unspecified: Secondary | ICD-10-CM

## 2023-02-18 MED ORDER — PREDNISONE 20 MG PO TABS: 40.0000 mg | ORAL_TABLET | Freq: Every day | ORAL | 0 refills | Status: DC

## 2023-02-18 NOTE — Telephone Encounter (Signed)
Opened in error

## 2023-02-18 NOTE — ED Triage Notes (Signed)
Pt presents with c/o lt sided chest pain that radiates down to the lt arm X 1 wk. Pt states it comes and goes and has felt it is worsening. Pt states she has numbness and tingling.

## 2023-02-18 NOTE — ED Provider Notes (Signed)
[facility]   [id number] 02/18/23 Arrival Time: 0809  ASSESSMENT & PLAN:  1. Chest pain, unspecified type     Patient history and exam consistent with non-cardiac cause of chest pain.  ECG: Performed today and interpreted by me: normal EKG, normal sinus rhythm.  I have personally viewed and independently interpreted the imaging studies ordered this visit. CXR: no acute changes; no pneumothorax.  Tylenol. Begin: Meds ordered this encounter  Medications   predniSONE (DELTASONE) 20 MG tablet    Sig: Take 2 tablets (40 mg total) by mouth daily.    Dispense:  10 tablet    Refill:  0    Follow-up Information     [facility] [place].   Specialty: Emergency Medicine Why: If symptoms worsen in any way. Contact information: [address] [place] [address] [phone]                Chest pain precautions given. Reviewed expectations re: course of current medical issues. Questions answered. Outlined signs and symptoms indicating need for more acute intervention. Patient verbalized understanding. After Visit Summary given.   SUBJECTIVE:  History from: patient. Carolyn Bryant is a 43 y.o. female who presents with complaint of sporadic L sided chest pain that radiates down to the L arm X 1 wk. Mostly "just a sharp pain of my left chest" when she experiences symptoms. Denies assoc n/v/SOB/diaphoresis. Occas mild tingling of L hand; none currently.  Social History   Tobacco Use  Smoking Status Every Day   Current packs/day: 0.10   Types: Cigarettes  Smokeless Tobacco Never   Social History   Substance and Sexual Activity  Alcohol Use Yes   Comment: occasional    OBJECTIVE:  Vitals:   02/18/23 0830  BP: 131/78  Pulse: 81  Resp: 20  Temp: 98.1 F (36.7 C)  TempSrc: Oral  SpO2: 99%    General appearance: alert, oriented, no acute distress Eyes: PERRLA; EOMI; conjunctivae  normal HENT: normocephalic; atraumatic Neck: supple with FROM Lungs: without labored respirations; speaks full sentences without difficulty; CTAB Heart: regular rate and rhythm without murmer Chest Wall: without tenderness to palpation Abdomen: soft, non-tender; no guarding or rebound tenderness Extremities: without edema; without calf swelling or tenderness; symmetrical without gross deformities Skin: warm and dry; without rash or lesions Neuro: normal gait Psychological: alert and cooperative; normal mood and affect   Allergies  Allergen Reactions   Peanuts [Peanut Oil] Hives and Swelling    Reaction: throat swelling   Prednisone Rash    Has had it since, Tolerated.    Past Medical History:  Diagnosis Date   Asthma    Social History   Socioeconomic History   Marital status: Single    Spouse name: Not on file   Number of children: Not on file   Years of education: Not on file   Highest education level: Not on file  Occupational History   Not on file  Tobacco Use   Smoking status: Every Day    Current packs/day: 0.10    Types: Cigarettes   Smokeless tobacco: Never  Vaping Use   Vaping status: Never Used  Substance and Sexual Activity   Alcohol use: Yes    Comment: occasional   Drug use: No   Sexual activity: Yes    Birth control/protection: None  Other Topics Concern   Not on file  Social History Narrative   Not on file   Social Drivers of  Health   Financial Resource Strain: Not on file  Food Insecurity: Not on file  Transportation Needs: Not on file  Physical Activity: Not on file  Stress: Not on file  Social Connections: Not on file  Intimate Partner Violence: Not on file   History reviewed. No pertinent family history. Past Surgical History:  Procedure Laterality Date   TUBAL LIGATION        Mardella Layman, MD 02/18/23 [phone]

## 2023-02-27 ENCOUNTER — Inpatient Hospital Stay (HOSPITAL_COMMUNITY)
Admission: EM | Admit: 2023-02-27 | Discharge: 2023-03-01 | DRG: 282 | Disposition: A | Discharge status: Home / Self Care | Payer: PRIVATE HEALTH INSURANCE | Attending: Cardiology | Admitting: Cardiology

## 2023-02-27 ENCOUNTER — Other Ambulatory Visit: Payer: Self-pay

## 2023-02-27 ENCOUNTER — Emergency Department (HOSPITAL_COMMUNITY): Payer: PRIVATE HEALTH INSURANCE

## 2023-02-27 ENCOUNTER — Encounter (HOSPITAL_COMMUNITY): Payer: Self-pay

## 2023-02-27 DIAGNOSIS — E7800 Pure hypercholesterolemia, unspecified: Secondary | ICD-10-CM | POA: Diagnosis present

## 2023-02-27 DIAGNOSIS — D509 Iron deficiency anemia, unspecified: Secondary | ICD-10-CM | POA: Diagnosis present

## 2023-02-27 DIAGNOSIS — F17210 Nicotine dependence, cigarettes, uncomplicated: Secondary | ICD-10-CM | POA: Diagnosis present

## 2023-02-27 DIAGNOSIS — Z7982 Long term (current) use of aspirin: Secondary | ICD-10-CM | POA: Diagnosis not present

## 2023-02-27 DIAGNOSIS — Z888 Allergy status to other drugs, medicaments and biological substances status: Secondary | ICD-10-CM | POA: Diagnosis not present

## 2023-02-27 DIAGNOSIS — R931 Abnormal findings on diagnostic imaging of heart and coronary circulation: Secondary | ICD-10-CM | POA: Diagnosis not present

## 2023-02-27 DIAGNOSIS — Z79899 Other long term (current) drug therapy: Secondary | ICD-10-CM | POA: Diagnosis not present

## 2023-02-27 DIAGNOSIS — J45909 Unspecified asthma, uncomplicated: Secondary | ICD-10-CM | POA: Diagnosis present

## 2023-02-27 DIAGNOSIS — Z91010 Allergy to peanuts: Secondary | ICD-10-CM | POA: Diagnosis not present

## 2023-02-27 DIAGNOSIS — Z716 Tobacco abuse counseling: Secondary | ICD-10-CM | POA: Diagnosis not present

## 2023-02-27 DIAGNOSIS — Z720 Tobacco use: Secondary | ICD-10-CM

## 2023-02-27 DIAGNOSIS — I10 Essential (primary) hypertension: Secondary | ICD-10-CM | POA: Diagnosis present

## 2023-02-27 DIAGNOSIS — M549 Dorsalgia, unspecified: Secondary | ICD-10-CM | POA: Diagnosis present

## 2023-02-27 DIAGNOSIS — R7989 Other specified abnormal findings of blood chemistry: Secondary | ICD-10-CM | POA: Diagnosis not present

## 2023-02-27 DIAGNOSIS — I2510 Atherosclerotic heart disease of native coronary artery without angina pectoris: Secondary | ICD-10-CM | POA: Diagnosis present

## 2023-02-27 DIAGNOSIS — I200 Unstable angina: Secondary | ICD-10-CM

## 2023-02-27 DIAGNOSIS — D62 Acute posthemorrhagic anemia: Secondary | ICD-10-CM | POA: Diagnosis not present

## 2023-02-27 DIAGNOSIS — I071 Rheumatic tricuspid insufficiency: Secondary | ICD-10-CM | POA: Diagnosis present

## 2023-02-27 DIAGNOSIS — I160 Hypertensive urgency: Secondary | ICD-10-CM | POA: Diagnosis present

## 2023-02-27 DIAGNOSIS — R079 Chest pain, unspecified: Principal | ICD-10-CM

## 2023-02-27 DIAGNOSIS — I214 Non-ST elevation (NSTEMI) myocardial infarction: Principal | ICD-10-CM | POA: Diagnosis present

## 2023-02-27 HISTORY — DX: Tobacco use: Z72.0

## 2023-02-27 LAB — CBC
HCT: 29.5 % — ABNORMAL LOW (ref 36.0–46.0)
Hemoglobin: 9 g/dL — ABNORMAL LOW (ref 12.0–15.0)
MCH: 23.2 pg — ABNORMAL LOW (ref 26.0–34.0)
MCHC: 30.5 g/dL (ref 30.0–36.0)
MCV: 76 fL — ABNORMAL LOW (ref 80.0–100.0)
Platelets: 410 10*3/uL — ABNORMAL HIGH (ref 150–400)
RBC: 3.88 MIL/uL (ref 3.87–5.11)
RDW: 20.3 % — ABNORMAL HIGH (ref 11.5–15.5)
WBC: 5 10*3/uL (ref 4.0–10.5)
nRBC: 0 % (ref 0.0–0.2)

## 2023-02-27 LAB — BASIC METABOLIC PANEL
Anion gap: 9 (ref 5–15)
BUN: 5 mg/dL — ABNORMAL LOW (ref 6–20)
CO2: 20 mmol/L — ABNORMAL LOW (ref 22–32)
Calcium: 9 mg/dL (ref 8.9–10.3)
Chloride: 107 mmol/L (ref 98–111)
Creatinine, Ser: 0.7 mg/dL (ref 0.44–1.00)
GFR, Estimated: >60 mL/min (ref >60)
Glucose, Bld: 121 mg/dL — ABNORMAL HIGH (ref 70–99)
Potassium: 3.8 mmol/L (ref 3.5–5.1)
Sodium: 136 mmol/L (ref 135–145)

## 2023-02-27 LAB — HEPATIC FUNCTION PANEL
ALT: 9 U/L (ref 0–44)
AST: 14 U/L — ABNORMAL LOW (ref 15–41)
Albumin: 3.7 g/dL (ref 3.5–5.0)
Alkaline Phosphatase: 41 U/L (ref 38–126)
Bilirubin, Direct: <0.1 mg/dL (ref 0.0–0.2)
Total Bilirubin: 0.4 mg/dL (ref 0.0–1.2)
Total Protein: 7.4 g/dL (ref 6.5–8.1)

## 2023-02-27 LAB — HEPARIN LEVEL (UNFRACTIONATED): Heparin Unfractionated: 0.6 [IU]/mL (ref 0.30–0.70)

## 2023-02-27 LAB — TROPONIN I (HIGH SENSITIVITY)
Troponin I (High Sensitivity): 36 ng/L — ABNORMAL HIGH (ref <18)
Troponin I (High Sensitivity): 75 ng/L — ABNORMAL HIGH (ref <18)

## 2023-02-27 LAB — LIPASE, BLOOD: Lipase: 23 U/L (ref 11–51)

## 2023-02-27 LAB — HCG, SERUM, QUALITATIVE: Preg, Serum: NEGATIVE

## 2023-02-27 MED ORDER — ACETAMINOPHEN 500 MG PO TABS: 500.0000 mg | ORAL_TABLET | Freq: Four times a day (QID) | ORAL | Status: DC | PRN

## 2023-02-27 MED ORDER — HEPARIN (PORCINE) 25000 UT/250ML-% IV SOLN
800.0000 [IU]/h | INTRAVENOUS | Status: DC
  Administered 2023-02-27: 850 [IU]/h via INTRAVENOUS
  Filled 2023-02-27: qty 250

## 2023-02-27 MED ORDER — ASPIRIN 81 MG PO CHEW
324.0000 mg | CHEWABLE_TABLET | Freq: Once | ORAL | Status: AC
  Administered 2023-02-27: 324 mg via ORAL
  Filled 2023-02-27: qty 4

## 2023-02-27 MED ORDER — ATORVASTATIN CALCIUM 80 MG PO TABS
80.0000 mg | ORAL_TABLET | Freq: Every day | ORAL | Status: DC
  Administered 2023-02-27 – 2023-03-01 (×3): 80 mg via ORAL
  Filled 2023-02-27: qty 1
  Filled 2023-02-27: qty 2
  Filled 2023-02-27: qty 1

## 2023-02-27 MED ORDER — ASPIRIN 81 MG PO TBEC
81.0000 mg | DELAYED_RELEASE_TABLET | Freq: Every day | ORAL | Status: DC
  Administered 2023-02-28 – 2023-03-01 (×2): 81 mg via ORAL
  Filled 2023-02-27 (×2): qty 1

## 2023-02-27 MED ORDER — TRAMADOL HCL 50 MG PO TABS: 50.0000 mg | ORAL_TABLET | ORAL | Status: DC

## 2023-02-27 MED ORDER — ISOSORBIDE MONONITRATE ER 30 MG PO TB24
15.0000 mg | ORAL_TABLET | Freq: Every day | ORAL | Status: DC
  Administered 2023-02-27 – 2023-03-01 (×3): 15 mg via ORAL
  Filled 2023-02-27 (×3): qty 1

## 2023-02-27 MED ORDER — HEPARIN BOLUS VIA INFUSION
4000.0000 [IU] | Freq: Once | INTRAVENOUS | Status: AC
  Administered 2023-02-27: 4000 [IU] via INTRAVENOUS
  Filled 2023-02-27: qty 4000

## 2023-02-27 MED ORDER — ALBUTEROL SULFATE (2.5 MG/3ML) 0.083% IN NEBU: 2.5000 mg | INHALATION_SOLUTION | Freq: Four times a day (QID) | RESPIRATORY_TRACT | Status: DC | PRN

## 2023-02-27 NOTE — ED Triage Notes (Signed)
 Pt bib [facility] from [facility] c/o cp/left arm pain.   Pt has had cp with left arm pain that started a week ago. Pt was given steroids. Pt states nothing has changed and the pain increased worse today. Pt had slightly elevated troponin. Pt received aspirin  and heparin  drip started at 850 units.  Hx asthma and current smoker  SBP 120's-140's RR 16 RA 100%

## 2023-02-27 NOTE — ED Provider Notes (Signed)
  Physical Exam  BP (!) 145/86   Pulse (!) 52   Temp 98.4 F (36.9 C) (Oral)   Resp 19   Ht 5' 2 (1.575 m)   Wt 61.2 kg   LMP 01/16/2023 (Exact Date)   SpO2 98%   BMI 24.68 kg/m   Physical Exam  Procedures  Procedures  ED Course / MDM    Medical Decision Making Patient transferred to [facility] ER around 4 pm for possible NSTEMI.  Patient is pain-free on my evaluation.  I called cardiology to see patient and medicine service to admit.  Patient remains hemodynamically stable  Problems Addressed: Chest pain, unspecified type: acute illness or injury Elevated troponin: acute illness or injury  Amount and/or Complexity of Data Reviewed Labs: ordered. Radiology: ordered.  Risk OTC drugs. Decision regarding hospitalization.          Carolyn Alm Macho, MD 02/27/23 [phone]

## 2023-02-27 NOTE — H&P (Signed)
 CARDIOLOGY HISTORY AND PHYSICAL NOTE    Patient ID: Carolyn Bryant; [id number]; 13-Feb-1980   Admit date: 02/27/2023 Date of Consult: 02/27/2023  Primary Care Provider: Pcp, No Primary Cardiologist:  Primary Electrophysiologist:     History of Present Illness:   Ms. Carolyn Bryant is a 43 year old F known to have nicotine abuse presented to the ER with chest pain.  Patient reported that chest pain woke her up from sleep early this morning radiating to her back and left shoulder.  Associated with diffuse diaphoresis, nausea and vomiting.  She continues to have achiness in her chest during my interview with radiation to her left shoulder.  She said pain comes and goes.  She also reported chest pains occurring recently but those were sharp chest pains.  Currently smokes cigarettes, 1 pack every 2 days.  Denies having any other symptoms of syncope, palpitations, leg swelling.  No prior ischemia evaluation.  Past Medical History:  Diagnosis Date   Asthma     Past Surgical History:  Procedure Laterality Date   TUBAL LIGATION       Inpatient Medications: Scheduled Meds:  traMADol   50 mg Oral UD   Continuous Infusions:  heparin  850 Units/hr (02/27/23 1557)   PRN Meds: acetaminophen , albuterol   Allergies:    Allergies  Allergen Reactions   Peanuts [Peanut Oil] Hives and Swelling    Reaction: throat swelling   Prednisone  Rash    Has had it since, Tolerated.    Social History:   Social History   Socioeconomic History   Marital status: Single    Spouse name: Not on file   Number of children: Not on file   Years of education: Not on file   Highest education level: Not on file  Occupational History   Not on file  Tobacco Use   Smoking status: Every Day    Current packs/day: 0.10    Types: Cigarettes   Smokeless tobacco: Never  Vaping Use   Vaping status: Never Used  Substance and Sexual Activity   Alcohol use: Yes    Comment: occasional   Drug use: No   Sexual  activity: Yes    Birth control/protection: None  Other Topics Concern   Not on file  Social History Narrative   Not on file   Social Drivers of Health   Financial Resource Strain: Not on file  Food Insecurity: Not on file  Transportation Needs: Not on file  Physical Activity: Not on file  Stress: Not on file  Social Connections: Not on file  Intimate Partner Violence: Not on file    Family History:   History reviewed. No pertinent family history.   ROS:  Please see the history of present illness.  ROS  All other ROS reviewed and negative.     Physical Exam/Data:   Vitals:   02/27/23 1630 02/27/23 1700 02/27/23 1715 02/27/23 1730  BP: (!) 144/110 (!) 142/95 (!) 132/94 (!) 145/86  Pulse: (!) 55 79 (!) 59 (!) 52  Resp: 19 18 17 19   Temp:      TempSrc:      SpO2: 100% 99% 100% 98%  Weight:      Height:       No intake or output data in the 24 hours ending 02/27/23 1824 Filed Weights   02/27/23 1025 02/27/23 1555  Weight: 61.2 kg 61.2 kg   Body mass index is 24.68 kg/m.  General:  Well nourished, well developed, in no acute distress HEENT: normal  Lymph: no adenopathy Neck: no JVD Endocrine:  No thryomegaly Vascular: No carotid bruits; FA pulses 2+ bilaterally without bruits  Cardiac:  normal S1, S2; RRR; no murmur  Lungs:  clear to auscultation bilaterally, no wheezing, rhonchi or rales  Abd: soft, nontender, no hepatomegaly  Ext: no edema Musculoskeletal:  No deformities, BUE and BLE strength normal and equal Skin: warm and dry  Neuro:  CNs 2-12 intact, no focal abnormalities noted Psych:  Normal affect   EKG:  The EKG was personally reviewed and demonstrates:   Telemetry:  Telemetry was personally reviewed and demonstrates:    Relevant CV Studies:   Laboratory Data:  Chemistry Recent Labs  Lab 02/27/23 1035  NA 136  K 3.8  CL 107  CO2 20*  GLUCOSE 121*  BUN 5*  CREATININE 0.70  CALCIUM  9.0  GFRNONAA >60  ANIONGAP 9    Recent Labs  Lab  02/27/23 1035  PROT 7.4  ALBUMIN 3.7  AST 14*  ALT 9  ALKPHOS 41  BILITOT 0.4   Hematology Recent Labs  Lab 02/27/23 1035  WBC 5.0  RBC 3.88  HGB 9.0*  HCT 29.5*  MCV 76.0*  MCH 23.2*  MCHC 30.5  RDW 20.3*  PLT 410*   Cardiac EnzymesNo results for input(s): TROPONINI in the last 168 hours. No results for input(s): TROPIPOC in the last 168 hours.  BNPNo results for input(s): BNP, PROBNP in the last 168 hours.  DDimer No results for input(s): DDIMER in the last 168 hours.  Radiology/Studies:  DG Chest 2 View Result Date: 02/27/2023 CLINICAL DATA:  Chest pain, shortness of breath, and cough. EXAM: CHEST - 2 VIEW COMPARISON:  Chest x-ray dated February 18, 2023. FINDINGS: The heart size and mediastinal contours are within normal limits. Both lungs are clear. The visualized skeletal structures are unremarkable. IMPRESSION: No active cardiopulmonary disease. Electronically Signed   By: Elsie ONEIDA Shoulder M.D.   On: 02/27/2023 11:07    Assessment and Plan:   Unstable angina HTN, poorly controlled Nicotine abuse   -Presented with chest pain waking her up from the sleep this morning and radiating to her back and left shoulder.  Associated with diffuse diaphoresis, nausea and vomiting.  Ongoing and intermittent symptoms after arrival to the ER but used to be comfortable. EKG showed NSR, no ischemia. Hs troponins mildly elevated, 36>>75.  Received aspirin  325 mg in the ER, start high intensity statin and heparin  drip.  Start Imdur  15 mg once daily to control HTN and this might help with her chest pain as well.  Smoking cessation counseling provided.  She will benefit from invasive ischemia evaluation with [facility] on Monday.  She is agreeable to the plan.   For questions or updates, please contact [facility] Please consult [email or web address] for contact info under Cardiology/STEMI.   Signed, Tineshia Becraft Priya Camree Wigington, MD 02/27/2023 6:24 PM

## 2023-02-27 NOTE — ED Triage Notes (Addendum)
 Pt BIB EMS from home with left shoulder pain that is going into her chest x 3 weeks. Pt was seen recently and dx with muscular issues. Pt is not able to move her left shoulder as much.

## 2023-02-27 NOTE — Progress Notes (Signed)
 PHARMACY - ANTICOAGULATION Pharmacy Consult for Heparin  Indication: chest pain/ACS Brief A/P: Heparin  level within goal range Continue Heparin  at current rate   Allergies  Allergen Reactions   Peanuts [Peanut Oil] Hives and Swelling    Reaction: throat swelling   Prednisone  Rash    Has had it since, Tolerated.    Patient Measurements: Height: 5' 2 (157.5 cm) Weight: 61.2 kg (134 lb 14.7 oz) IBW/kg (Calculated) : 50.1 Heparin  Dosing Weight: 61.2 kg  Vital Signs: Temp: 97.8 F (36.6 C) (02/08 2214) Temp Source: Oral (02/08 2214) BP: 126/87 (02/08 2214) Pulse Rate: 63 (02/08 2214)  Labs: Recent Labs    02/27/23 1035 02/27/23 1306 02/27/23 2254  HGB 9.0*  --   --   HCT 29.5*  --   --   PLT 410*  --   --   HEPARINUNFRC  --   --  0.60  CREATININE 0.70  --   --   TROPONINIHS 36* 75*  --     Estimated Creatinine Clearance: 79.6 mL/min (by C-G formula based on SCr of 0.7 mg/dL).  Assessment: 43 y.o. female with chest pain for heparin    Goal of Therapy:  Heparin  level 0.3-0.7 units/ml Monitor platelets by anticoagulation protocol: Yes   Plan:  No change to heparin   Follow-up am labs.  Cathlyn Arrant, PharmD, BCPS

## 2023-02-27 NOTE — Progress Notes (Signed)
 PHARMACY - ANTICOAGULATION CONSULT NOTE  Pharmacy Consult for Heparin  Indication: chest pain/ACS  Allergies  Allergen Reactions   Peanuts [Peanut Oil] Hives and Swelling    Reaction: throat swelling   Prednisone  Rash    Has had it since, Tolerated.    Patient Measurements: Height: 5' 2 (157.5 cm) Weight: 61.2 kg (134 lb 14.7 oz) IBW/kg (Calculated) : 50.1 Heparin  Dosing Weight: 61.2 kg  Vital Signs: Temp: 97.4 F (36.3 C) (02/08 1416) Temp Source: Oral (02/08 1416) BP: 132/90 (02/08 1416) Pulse Rate: 93 (02/08 1416)  Labs: Recent Labs    02/27/23 1035 02/27/23 1306  HGB 9.0*  --   HCT 29.5*  --   PLT 410*  --   CREATININE 0.70  --   TROPONINIHS 36* 75*    Estimated Creatinine Clearance: 79.6 mL/min (by C-G formula based on SCr of 0.7 mg/dL).   Medical History: Past Medical History:  Diagnosis Date   Asthma     Assessment: Active Problem(s): left shoulder pain that is going into her chest/arm x 3 weeks. Pt was seen recently and dx with muscular issues (given prednisone ) - Intermittent SOB now with new diaphoresis and N/V - Troponin 36>>75  PMH:  asthma, GERD with pregnancy, anemia  AC/Heme: IV heparin  for ACS, +troponin Baseline Hgb 9, Plts 410  Goal of Therapy:  Heparin  level 0.3-0.7 units/ml Monitor platelets by anticoagulation protocol: Yes   Plan:  IV heparin  4000 unit IV bolus IV heparin  850 units/hr Check heparin  level in 6-8hrs Daily HL and CBC.     Avalon Coppinger S. Casimir, PharmD, BCPS Clinical Staff Pharmacist Casimir Salines Stillinger 02/27/2023,2:27 PM

## 2023-02-27 NOTE — ED Provider Triage Note (Signed)
 Emergency Medicine Provider Triage Evaluation Note  Carolyn Bryant , a 43 y.o. female  was evaluated in triage.  Pt complains of chest pain x 3 weeks. It was previously intermittent, thought it was a pulled muscle or her asthma. Pain getting more consistent. Went to UC and was diagnosed with muscular pain, given steroids. Pain is getting worse and taking her breath away. Other than HTN, denies hx of cardiac problems.   Review of Systems  Positive: CP, SOB, wheezing, cough, vomiting Negative:   Physical Exam  BP (!) 138/98 (BP Location: Right Arm)   Pulse 60   Temp 98 F (36.7 C) (Oral)   Resp 16   Ht 5' 2 (1.575 m)   Wt 61.2 kg   LMP 01/16/2023 (Exact Date)   SpO2 100%   BMI 24.68 kg/m  Gen:   Awake, no distress   Resp:  Normal effort  MSK:   Moves extremities without difficulty  Other:    Medical Decision Making  Medically screening exam initiated at 1:13 PM.  Appropriate orders placed.  Carolyn Bryant was informed that the remainder of the evaluation will be completed by another provider, this initial triage assessment does not replace that evaluation, and the importance of remaining in the ED until their evaluation is complete.  Workup initiated, first troponin 36, delta troponin pending. CXR normal   Taeya Theall T, PA-C 02/27/23 1313

## 2023-02-27 NOTE — ED Notes (Signed)
 [facility] called for [facility],.

## 2023-02-27 NOTE — ED Provider Notes (Addendum)
 [facility] EMERGENCY DEPARTMENT AT [facility] Provider Note   CSN: [id number] Arrival date & time: 02/27/23  1020     History  Chief Complaint  Patient presents with   Chest Pain    Carolyn Bryant is a 43 y.o. female.  The history is provided by the patient and medical records. No language interpreter was used.  Chest Pain Pain location:  Substernal area and L chest Pain quality: aching, crushing, dull, pressure and tightness   Pain radiates to:  L shoulder and L arm Pain severity:  Severe Onset quality:  Gradual Duration:  3 weeks Timing:  Sporadic Progression:  Waxing and waning Chronicity:  New Relieved by:  Nothing Worsened by:  Nothing Ineffective treatments:  None tried Associated symptoms: diaphoresis, nausea, shortness of breath and vomiting   Associated symptoms: no abdominal pain, no altered mental status, no back pain, no cough, no fatigue, no fever, no headache, no lower extremity edema and no palpitations        Home Medications Prior to Admission medications   Medication Sig Start Date End Date Taking? Authorizing Provider  acetaminophen  (TYLENOL ) 500 MG tablet Take 1 tablet (500 mg total) by mouth every 6 (six) hours as needed. 12/22/20   Nivia Colon, PA-C  albuterol  (VENTOLIN  HFA) 108 (90 Base) MCG/ACT inhaler Inhale 2 puffs into the lungs every 6 (six) hours as needed for wheezing or shortness of breath. 12/13/22   Griselda Norris, MD  amoxicillin  (AMOXIL ) 500 MG capsule Take 1 capsule (500 mg total) by mouth 3 (three) times daily. 11/10/22   Billy Asberry JULIANNA, PA-C  benzonatate  (TESSALON ) 100 MG capsule Take 1 capsule (100 mg total) by mouth 3 (three) times daily as needed for cough. 12/13/22   Griselda Norris, MD  FEROSUL 325 (65 Fe) MG tablet Take 325 mg by mouth daily. 10/16/22   [provider]  ibuprofen  (ADVIL ) 200 MG tablet Take 200 mg by mouth every 6 (six) hours as needed.    [provider]  lidocaine  (XYLOCAINE ) 2 %  solution Use as directed 15 mLs in the mouth or throat every 4 (four) hours as needed for mouth pain. Swish/gargle and spit 05/29/21   Graham, Laura E, PA-C  meloxicam  (MOBIC ) 15 MG tablet Take 1 tablet (15 mg total) by mouth daily. Patient not taking: Reported on 12/13/2021 07/04/21   Persons, Ronal Dragon, [place]  ondansetron  (ZOFRAN ) 4 MG tablet Take 1 tablet (4 mg total) by mouth every 8 (eight) hours as needed for nausea or vomiting. 05/22/22   Logan Ubaldo NOVAK, PA-C  penicillin  v potassium (VEETID) 500 MG tablet Take 500 mg by mouth as directed. 01/25/17   [provider]  predniSONE  (DELTASONE ) 20 MG tablet Take 2 tablets (40 mg total) by mouth daily. 02/18/23   Rolinda Rogue, MD  traMADol  (ULTRAM ) 50 MG tablet Take 50 mg by mouth as directed. 01/25/17   [provider]      Allergies    Peanuts [peanut oil] and Prednisone     Review of Systems   Review of Systems  Constitutional:  Positive for diaphoresis. Negative for chills, fatigue and fever.  HENT:  Negative for congestion.   Respiratory:  Positive for chest tightness and shortness of breath. Negative for cough and wheezing.   Cardiovascular:  Positive for chest pain. Negative for palpitations and leg swelling.  Gastrointestinal:  Positive for nausea and vomiting. Negative for abdominal pain, constipation and diarrhea.  Genitourinary:  Negative for dysuria.  Musculoskeletal:  Negative for back pain and neck pain.  Skin:  Negative for rash.  Neurological:  Negative for light-headedness and headaches.  Psychiatric/Behavioral:  Negative for agitation.   All other systems reviewed and are negative.   Physical Exam Updated Vital Signs BP (!) 138/98 (BP Location: Right Arm)   Pulse 60   Temp 98 F (36.7 C) (Oral)   Resp 16   Ht 5' 2 (1.575 m)   Wt 61.2 kg   LMP 01/16/2023 (Exact Date)   SpO2 100%   BMI 24.68 kg/m  Physical Exam Vitals and nursing note reviewed.  Constitutional:      General: She is not in acute  distress.    Appearance: She is well-developed. She is not ill-appearing, toxic-appearing or diaphoretic.  HENT:     Head: Normocephalic and atraumatic.  Eyes:     Conjunctiva/sclera: Conjunctivae normal.     Pupils: Pupils are equal, round, and reactive to light.  Cardiovascular:     Rate and Rhythm: Normal rate and regular rhythm.     Heart sounds: Normal heart sounds. No murmur heard. Pulmonary:     Effort: Pulmonary effort is normal. No respiratory distress.     Breath sounds: Normal breath sounds. No wheezing, rhonchi or rales.  Chest:     Chest wall: No tenderness.  Abdominal:     Palpations: Abdomen is soft.     Tenderness: There is no abdominal tenderness.  Musculoskeletal:        General: No swelling.     Cervical back: Neck supple.     Right lower leg: No tenderness. No edema.     Left lower leg: No tenderness. No edema.  Skin:    General: Skin is warm and dry.     Capillary Refill: Capillary refill takes less than 2 seconds.     Findings: No erythema or rash.  Neurological:     Mental Status: She is alert.  Psychiatric:        Mood and Affect: Mood normal.     ED Results / Procedures / Treatments   Labs (all labs ordered are listed, but only abnormal results are displayed) Labs Reviewed  BASIC METABOLIC PANEL - Abnormal; Notable for the following components:      Result Value   CO2 20 (*)    Glucose, Bld 121 (*)    BUN 5 (*)    All other components within normal limits  CBC - Abnormal; Notable for the following components:   Hemoglobin 9.0 (*)    HCT 29.5 (*)    MCV 76.0 (*)    MCH 23.2 (*)    RDW 20.3 (*)    Platelets 410 (*)    All other components within normal limits  TROPONIN I (HIGH SENSITIVITY) - Abnormal; Notable for the following components:   Troponin I (High Sensitivity) 36 (*)    All other components within normal limits  TROPONIN I (HIGH SENSITIVITY) - Abnormal; Notable for the following components:   Troponin I (High Sensitivity) 75 (*)     All other components within normal limits  HCG, SERUM, QUALITATIVE  HEPATIC FUNCTION PANEL  LIPASE, BLOOD    EKG EKG Interpretation Date/Time:  Saturday February 27 2023 13:14:06 EST Ventricular Rate:  55 PR Interval:  108 QRS Duration:  85 QT Interval:  412 QTC Calculation: 394 R Axis:   57  Text Interpretation: Sinus rhythm Short PR interval RSR' in V1 or V2, probably normal variant when compared to prior, overall similar appearance.  No STEMI Confirmed by Ginger Barefoot ([id number]) on 02/27/2023 1:31:53 PM  Radiology DG Chest 2 View Result Date: 02/27/2023 CLINICAL DATA:  Chest pain, shortness of breath, and cough. EXAM: CHEST - 2 VIEW COMPARISON:  Chest x-ray dated February 18, 2023. FINDINGS: The heart size and mediastinal contours are within normal limits. Both lungs are clear. The visualized skeletal structures are unremarkable. IMPRESSION: No active cardiopulmonary disease. Electronically Signed   By: Elsie ONEIDA Shoulder M.D.   On: 02/27/2023 11:07    Procedures Procedures   CRITICAL CARE Performed by: Lonni PARAS Esma Kilts Total critical care time: 35 minutes Critical care time was exclusive of separately billable procedures and treating other patients. Critical care was necessary to treat or prevent imminent or life-threatening deterioration. Critical care was time spent personally by me on the following activities: development of treatment plan with patient and/or surrogate as well as nursing, discussions with consultants, evaluation of patient's response to treatment, examination of patient, obtaining history from patient or surrogate, ordering and performing treatments and interventions, ordering and review of laboratory studies, ordering and review of radiographic studies, pulse oximetry and re-evaluation of patient's condition.   Medications Ordered in ED Medications  aspirin  chewable tablet 324 mg (324 mg Oral Given 02/27/23 1434)    ED Course/ Medical Decision Making/  A&P                                 Medical Decision Making Amount and/or Complexity of Data Reviewed Labs: ordered. Radiology: ordered.  Risk OTC drugs.    DELORA GRAVATT is a 43 y.o. female with a past medical history significant for asthma and some reflux troubles when she was previously pregnant who presents with chest pain.  According to patient, for last few weeks she has been having sporadic chest discomfort in her central chest and left chest that goes into her left arm with tingling.  She reports intermittent shortness of breath with it.  She reports today it was worsened and she was having diaphoresis and some nausea and vomiting.  This was new today.  Denies trauma.  Denies any fevers, chills, congestion, or cough.  No recent URI symptoms.  She denies any leg pain or leg swelling and denies any personal or family history of thromboembolic disease.  She does report her mother had some heart troubles but she is not sure the details.  She reports the discomfort is a crushing and aching in the chest that goes up the left side to her shoulder.  She does not describe it as exertional or pleuritic but she does not describe it as burning or related to oral intake.  Denies abdominal pain or flank pain.  Denies any constipation, diarrhea, or urinary changes.  EKG on arrival does not show STEMI.  On exam, lungs are clear and chest is nontender.  I cannot reproduce her discomfort.  No pulse in extremities.  Legs nontender nonedematous.  Abdomen nontender.  Good bowel sounds.  Back and flanks nontender.  Patient otherwise well-appearing.  Patient had screening labs and workup starting in triage.  X-ray does not show pneumothorax or other acute abnormality.  She is not pregnant.  CBC showed similar anemia to what she has had of last few years and no leukocytosis.  Metabolic panel was all reassuring with no AKI or significant electrolyte disturbance.  Her troponin however was elevated.  Initially  it was 36 and then rose to 75  on the delta Trop.  We will get hepatic function and lipase given nausea and vomiting today however we will call cardiology NSTEMI with negative presentation NSTEMI.  Hematology persistent neurosymptoms plus with her lack of pleuritic symptoms tachycardia tachypnea or hypoxia, low suspicion for PE.   Anticipate admission for further management of chest discomfort in setting of rising troponin.  2:32 PM Spoke to Dr. Mallipeddi with cardiology who would like us  to give aspirin  and heparin  drip started.  They want to do ED transfer so she can get seen by cardiology this afternoon to discuss admission and likely Cath Lab.  They do not want her to wait a long time here in the [facility] long emergency department waiting for ecchymosis, bed.  Spoke to Dr. Towana who accepts the patient for ED to ED transfer to get seen by cardiology.         Final Clinical Impression(s) / ED Diagnoses Final diagnoses:  Chest pain, unspecified type  Elevated troponin    Clinical Impression: 1. Chest pain, unspecified type   2. Elevated troponin     Disposition: ED to ED transfer accepted by Dr. Towana and Dr. Mallipeddi to get seen by cardiology prior to admission.  This note was prepared with assistance of Conservation officer, historic buildings. Occasional wrong-word or sound-a-like substitutions may have occurred due to the inherent limitations of voice recognition software.      Serin Thornell, Lonni PARAS, MD 02/27/23 1434    Velinda Wrobel, Lonni PARAS, MD 02/27/23 1434

## 2023-02-28 ENCOUNTER — Inpatient Hospital Stay (HOSPITAL_COMMUNITY): Payer: PRIVATE HEALTH INSURANCE

## 2023-02-28 DIAGNOSIS — I10 Essential (primary) hypertension: Secondary | ICD-10-CM | POA: Diagnosis not present

## 2023-02-28 DIAGNOSIS — I214 Non-ST elevation (NSTEMI) myocardial infarction: Secondary | ICD-10-CM | POA: Diagnosis not present

## 2023-02-28 DIAGNOSIS — D62 Acute posthemorrhagic anemia: Secondary | ICD-10-CM

## 2023-02-28 LAB — COMPREHENSIVE METABOLIC PANEL
ALT: 8 U/L (ref 0–44)
AST: 12 U/L — ABNORMAL LOW (ref 15–41)
Albumin: 3 g/dL — ABNORMAL LOW (ref 3.5–5.0)
Alkaline Phosphatase: 35 U/L — ABNORMAL LOW (ref 38–126)
Anion gap: 10 (ref 5–15)
BUN: 6 mg/dL (ref 6–20)
CO2: 21 mmol/L — ABNORMAL LOW (ref 22–32)
Calcium: 8.6 mg/dL — ABNORMAL LOW (ref 8.9–10.3)
Chloride: 107 mmol/L (ref 98–111)
Creatinine, Ser: 0.73 mg/dL (ref 0.44–1.00)
GFR, Estimated: >60 mL/min (ref >60)
Glucose, Bld: 106 mg/dL — ABNORMAL HIGH (ref 70–99)
Potassium: 3.5 mmol/L (ref 3.5–5.1)
Sodium: 138 mmol/L (ref 135–145)
Total Bilirubin: 0.4 mg/dL (ref 0.0–1.2)
Total Protein: 6 g/dL — ABNORMAL LOW (ref 6.5–8.1)

## 2023-02-28 LAB — IRON AND TIBC
Iron: 31 ug/dL (ref 28–170)
Saturation Ratios: 8 % — ABNORMAL LOW (ref 10.4–31.8)
TIBC: 410 ug/dL (ref 250–450)
UIBC: 379 ug/dL

## 2023-02-28 LAB — CBC
HCT: 23.9 % — ABNORMAL LOW (ref 36.0–46.0)
HCT: 24.1 % — ABNORMAL LOW (ref 36.0–46.0)
Hemoglobin: 7.4 g/dL — ABNORMAL LOW (ref 12.0–15.0)
Hemoglobin: 7.5 g/dL — ABNORMAL LOW (ref 12.0–15.0)
MCH: 23.3 pg — ABNORMAL LOW (ref 26.0–34.0)
MCH: 23.6 pg — ABNORMAL LOW (ref 26.0–34.0)
MCHC: 31 g/dL (ref 30.0–36.0)
MCHC: 31.1 g/dL (ref 30.0–36.0)
MCV: 75.2 fL — ABNORMAL LOW (ref 80.0–100.0)
MCV: 75.8 fL — ABNORMAL LOW (ref 80.0–100.0)
Platelets: 358 10*3/uL (ref 150–400)
Platelets: 370 10*3/uL (ref 150–400)
RBC: 3.18 MIL/uL — ABNORMAL LOW (ref 3.87–5.11)
RBC: 3.18 MIL/uL — ABNORMAL LOW (ref 3.87–5.11)
RDW: 19.8 % — ABNORMAL HIGH (ref 11.5–15.5)
RDW: 19.8 % — ABNORMAL HIGH (ref 11.5–15.5)
WBC: 4.8 10*3/uL (ref 4.0–10.5)
WBC: 4 10*3/uL (ref 4.0–10.5)
nRBC: 0 % (ref 0.0–0.2)
nRBC: 0 % (ref 0.0–0.2)

## 2023-02-28 LAB — TYPE AND SCREEN
ABO/RH(D): O POS
Antibody Screen: NEGATIVE

## 2023-02-28 LAB — LIPID PANEL
Cholesterol: 230 mg/dL — ABNORMAL HIGH (ref 0–200)
HDL: 55 mg/dL (ref >40)
LDL Cholesterol: 155 mg/dL — ABNORMAL HIGH (ref 0–99)
Total CHOL/HDL Ratio: 4.2 {ratio}
Triglycerides: 102 mg/dL (ref <150)
VLDL: 20 mg/dL (ref 0–40)

## 2023-02-28 LAB — TROPONIN I (HIGH SENSITIVITY)
Troponin I (High Sensitivity): 68 ng/L — ABNORMAL HIGH (ref <18)
Troponin I (High Sensitivity): 70 ng/L — ABNORMAL HIGH (ref <18)

## 2023-02-28 LAB — ECHOCARDIOGRAM COMPLETE
Area-P 1/2: 2.62 cm2
Height: 62 in
S' Lateral: 2.7 cm
Weight: 2158.74 [oz_av]

## 2023-02-28 LAB — ABO/RH: ABO/RH(D): O POS

## 2023-02-28 LAB — HEMOGLOBIN A1C
Hgb A1c MFr Bld: 5.9 % — ABNORMAL HIGH (ref 4.8–5.6)
Mean Plasma Glucose: 122.63 mg/dL

## 2023-02-28 LAB — FERRITIN: Ferritin: 1 ng/mL — ABNORMAL LOW (ref 11–307)

## 2023-02-28 LAB — HEPARIN LEVEL (UNFRACTIONATED): Heparin Unfractionated: 0.74 [IU]/mL — ABNORMAL HIGH (ref 0.30–0.70)

## 2023-02-28 LAB — HIV ANTIBODY (ROUTINE TESTING W REFLEX): HIV Screen 4th Generation wRfx: NONREACTIVE

## 2023-02-28 NOTE — Progress Notes (Signed)
 Patient latest hemoglobin level is 7.4 g/dl from 9.0 g/dl yesterday, no signs of overt bleeding, patient reported that her menstruation flow is normal same as her usual period. Paged Dr. Tobie and he ordered to repeat CBC and type and screen. Continue to monitor vital signs  and watch out for signs  bleeding. Proper patient education on how to report bleeding provided to patient and verbalized understanding.

## 2023-02-28 NOTE — Progress Notes (Signed)
 PHARMACY - ANTICOAGULATION CONSULT NOTE  Pharmacy Consult for heparin  Indication: chest pain/ACS  Allergies  Allergen Reactions   Peanuts [Peanut Oil] Hives and Swelling    Reaction: throat swelling   Prednisone  Rash    Has had it since, Tolerated.    Patient Measurements: Height: 5' 2 (157.5 cm) Weight: 61.2 kg (134 lb 14.7 oz) IBW/kg (Calculated) : 50.1 Heparin  Dosing Weight: 61.2kg  Vital Signs: Temp: 98.3 F (36.8 C) (02/09 0500) Temp Source: Oral (02/09 0500) BP: 99/79 (02/09 0500) Pulse Rate: 62 (02/09 0500)  Labs: Recent Labs    02/27/23 1035 02/27/23 1306 02/27/23 2254 02/28/23 0300  HGB 9.0*  --   --  7.4*  HCT 29.5*  --   --  23.9*  PLT 410*  --   --  358  HEPARINUNFRC  --   --  0.60 0.74*  CREATININE 0.70  --   --  0.73  TROPONINIHS 36* 75*  --   --     Estimated Creatinine Clearance: 79.6 mL/min (by C-G formula based on SCr of 0.73 mg/dL).   Medical History: Past Medical History:  Diagnosis Date   Asthma     Assessment: 42YOF with PMH asthma presented with left shoulder pain radiating to her chest/arm x 3 weeks. She now has intermittent SOB with new diaphoresis and N/V. Pharmacy consulted to dose heparin  for ACS.  Troponin 36>>75  Heparin  level 0.74, slightly supratherapeutic with heparin  infusion at 850 units/h. Hgb 7.4, down from 9, plt 358 WNL. Of note, today 2/9 is the first day of her menstrual cycle. MD notified of downtrending Hgb and current menstruation. No other signs or symptoms of bleeding or increases in menstrual bleeding noted.   Goal of Therapy:  Heparin  level 0.3-0.7 units/ml Monitor platelets by anticoagulation protocol: Yes   Plan:  Decrease heparin  infusion to 800 units/hr Check anti-Xa level in 6 hours and daily while on heparin  Continue to monitor CBC and for signs/symptoms of bleeding  Joesph Specking, PharmD PGY1 Pharmacy Resident 02/28/2023 7:14 AM

## 2023-02-28 NOTE — Progress Notes (Signed)
 Progress Note  Patient Name: Carolyn Bryant Date of Encounter: 02/28/2023  Primary Cardiologist: None  Subjective   No events overnight.  No chest pain overnight.  Inpatient Medications    Scheduled Meds:  aspirin  EC  81 mg Oral Daily   atorvastatin   80 mg Oral Daily   isosorbide  mononitrate  15 mg Oral Daily   Continuous Infusions:  PRN Meds: acetaminophen , albuterol    Vital Signs    Vitals:   02/28/23 0134 02/28/23 0500 02/28/23 0738 02/28/23 1115  BP: 99/65 99/79 132/75 (!) 128/92  Pulse: 97 62 66 84  Resp: 16 16 17 17   Temp: 98 F (36.7 C) 98.3 F (36.8 C) 98.1 F (36.7 C) 98.4 F (36.9 C)  TempSrc: Oral Oral Oral Oral  SpO2: 97% 100% 100% 100%  Weight:      Height:        Intake/Output Summary (Last 24 hours) at 02/28/2023 1144 Last data filed at 02/28/2023 0926 Gross per 24 hour  Intake 547.95 ml  Output 180 ml  Net 367.95 ml   Filed Weights   02/27/23 1025 02/27/23 1555  Weight: 61.2 kg 61.2 kg    Telemetry     Personally reviewed.  NSR.  ECG    Not performed today  Physical Exam   GEN: No acute distress.   Neck: No JVD. Cardiac: RRR, no murmur, rub, or gallop.  Respiratory: Nonlabored. Clear to auscultation bilaterally. GI: Soft, nontender, bowel sounds present. MS: No edema; No deformity. Neuro:  Nonfocal. Psych: Alert and oriented x 3. Normal affect.  Labs    Chemistry Recent Labs  Lab 02/27/23 1035 02/28/23 0300  NA 136 138  K 3.8 3.5  CL 107 107  CO2 20* 21*  GLUCOSE 121* 106*  BUN 5* 6  CREATININE 0.70 0.73  CALCIUM  9.0 8.6*  PROT 7.4 6.0*  ALBUMIN 3.7 3.0*  AST 14* 12*  ALT 9 8  ALKPHOS 41 35*  BILITOT 0.4 0.4  GFRNONAA >60 >60  ANIONGAP 9 10     Hematology Recent Labs  Lab 02/27/23 1035 02/28/23 0300 02/28/23 0811  WBC 5.0 4.8 4.0  RBC 3.88 3.18* 3.18*  HGB 9.0* 7.4* 7.5*  HCT 29.5* 23.9* 24.1*  MCV 76.0* 75.2* 75.8*  MCH 23.2* 23.3* 23.6*  MCHC 30.5 31.0 31.1  RDW 20.3* 19.8* 19.8*  PLT 410*  358 370    Cardiac Enzymes Recent Labs  Lab 02/27/23 1035 02/27/23 1306 02/28/23 0921  TROPONINIHS 36* 75* 68*    BNPNo results for input(s): BNP, PROBNP in the last 168 hours.   DDimerNo results for input(s): DDIMER in the last 168 hours.   Radiology    DG Chest 2 View Result Date: 02/27/2023 CLINICAL DATA:  Chest pain, shortness of breath, and cough. EXAM: CHEST - 2 VIEW COMPARISON:  Chest x-ray dated February 18, 2023. FINDINGS: The heart size and mediastinal contours are within normal limits. Both lungs are clear. The visualized skeletal structures are unremarkable. IMPRESSION: No active cardiopulmonary disease. Electronically Signed   By: Elsie ONEIDA Shoulder M.D.   On: 02/27/2023 11:07    Assessment & Plan   Chest pain initially concerning for unstable angina and hence started on ACS protocol but due to acute drop in hemoglobin from 9 to 7.4 this a.m., will hold off on heparin  drip and obtain CTA cardiac. No chest pain overnight.  Will continue aspirin  81 mg once daily, atorvastatin  80 mg nightly and Imdur  15 mg once daily.  Patient reported onset  of menses coinciding with acute drop in hemoglobin.  Will monitor for now.  No active bleeding in the GI tract.   Signed, Diannah SHAUNNA [place]wood, MD  02/28/2023, 11:44 AM

## 2023-02-28 NOTE — Plan of Care (Signed)

## 2023-02-28 NOTE — Plan of Care (Signed)
  Problem: Clinical Measurements: Goal: Will remain free from infection Outcome: Progressing Goal: Diagnostic test results will improve Outcome: Progressing Goal: Respiratory complications will improve Outcome: Progressing Goal: Cardiovascular complication will be avoided Outcome: Progressing   Problem: Activity: Goal: Risk for activity intolerance will decrease Outcome: Progressing   Problem: Elimination: Goal: Will not experience complications related to bowel motility Outcome: Progressing   Problem: Safety: Goal: Ability to remain free from injury will improve Outcome: Progressing   Problem: Skin Integrity: Goal: Risk for impaired skin integrity will decrease Outcome: Progressing

## 2023-02-28 NOTE — Progress Notes (Addendum)
 Ordering cardiac CT due to drop in hemoglobin.  Please evaluate tomorrow and order appropriate metoprolol dosing based off of heart rates and timing of procedure. NPO midnight.

## 2023-02-28 NOTE — Progress Notes (Signed)
 Assisted patient to bathroom, pt stated that she have her 1st day of monthly period today, sanitary pad provided. Educated about the heparin  infusion indication and side effects. Educated pt to report excessive amount of menstruation. Monitor vital signs and wacth of for any signs of bleeding.  0317H- Central Telemetry called and informed about the HR as low as 49, seen patient asleep but easily awaken, denieschest pain, shortness of breath,  dizziness and headache. Kept safe and comfortable.

## 2023-03-01 ENCOUNTER — Other Ambulatory Visit: Payer: Self-pay | Admitting: Internal Medicine

## 2023-03-01 ENCOUNTER — Inpatient Hospital Stay (HOSPITAL_COMMUNITY)
Admit: 2023-03-01 | Discharge: 2023-03-01 | Disposition: A | Discharge status: Home / Self Care | Payer: PRIVATE HEALTH INSURANCE | Attending: Internal Medicine | Admitting: Internal Medicine

## 2023-03-01 ENCOUNTER — Inpatient Hospital Stay (HOSPITAL_COMMUNITY): Payer: PRIVATE HEALTH INSURANCE

## 2023-03-01 ENCOUNTER — Other Ambulatory Visit (HOSPITAL_COMMUNITY): Payer: Self-pay

## 2023-03-01 DIAGNOSIS — R7989 Other specified abnormal findings of blood chemistry: Secondary | ICD-10-CM

## 2023-03-01 DIAGNOSIS — R931 Abnormal findings on diagnostic imaging of heart and coronary circulation: Secondary | ICD-10-CM

## 2023-03-01 DIAGNOSIS — R079 Chest pain, unspecified: Secondary | ICD-10-CM | POA: Diagnosis not present

## 2023-03-01 DIAGNOSIS — Z720 Tobacco use: Secondary | ICD-10-CM | POA: Diagnosis not present

## 2023-03-01 DIAGNOSIS — I2510 Atherosclerotic heart disease of native coronary artery without angina pectoris: Secondary | ICD-10-CM | POA: Diagnosis not present

## 2023-03-01 HISTORY — DX: Other specified abnormal findings of blood chemistry: R79.89

## 2023-03-01 HISTORY — DX: Chest pain, unspecified: R07.9

## 2023-03-01 LAB — LIPOPROTEIN A (LPA): Lipoprotein (a): 111.4 nmol/L — ABNORMAL HIGH (ref <75.0)

## 2023-03-01 LAB — CBC
HCT: 23.6 % — ABNORMAL LOW (ref 36.0–46.0)
Hemoglobin: 7.4 g/dL — ABNORMAL LOW (ref 12.0–15.0)
MCH: 23.2 pg — ABNORMAL LOW (ref 26.0–34.0)
MCHC: 31.4 g/dL (ref 30.0–36.0)
MCV: 74 fL — ABNORMAL LOW (ref 80.0–100.0)
Platelets: 373 10*3/uL (ref 150–400)
RBC: 3.19 MIL/uL — ABNORMAL LOW (ref 3.87–5.11)
RDW: 19.6 % — ABNORMAL HIGH (ref 11.5–15.5)
WBC: 4.2 10*3/uL (ref 4.0–10.5)
nRBC: 0 % (ref 0.0–0.2)

## 2023-03-01 LAB — BASIC METABOLIC PANEL
Anion gap: 10 (ref 5–15)
BUN: 5 mg/dL — ABNORMAL LOW (ref 6–20)
CO2: 21 mmol/L — ABNORMAL LOW (ref 22–32)
Calcium: 9 mg/dL (ref 8.9–10.3)
Chloride: 108 mmol/L (ref 98–111)
Creatinine, Ser: 0.64 mg/dL (ref 0.44–1.00)
GFR, Estimated: >60 mL/min (ref >60)
Glucose, Bld: 91 mg/dL (ref 70–99)
Potassium: 3.8 mmol/L (ref 3.5–5.1)
Sodium: 139 mmol/L (ref 135–145)

## 2023-03-01 MED ORDER — ATORVASTATIN CALCIUM 80 MG PO TABS
80.0000 mg | ORAL_TABLET | Freq: Every day | ORAL | 3 refills | Status: DC
  Filled 2023-03-01: qty 30, 30d supply, fill #0
  Filled 2023-03-29: qty 30, 30d supply, fill #1

## 2023-03-01 MED ORDER — ISOSORBIDE MONONITRATE ER 30 MG PO TB24
15.0000 mg | ORAL_TABLET | Freq: Every day | ORAL | 3 refills | Status: DC
  Filled 2023-03-01: qty 15, 30d supply, fill #0
  Filled 2023-03-29: qty 15, 30d supply, fill #1

## 2023-03-01 MED ORDER — NITROGLYCERIN 0.4 MG SL SUBL
SUBLINGUAL_TABLET | SUBLINGUAL | Status: AC
  Filled 2023-03-01: qty 2

## 2023-03-01 MED ORDER — IOHEXOL 350 MG/ML SOLN
95.0000 mL | Freq: Once | INTRAVENOUS | Status: AC | PRN
  Administered 2023-03-01: 95 mL via INTRAVENOUS

## 2023-03-01 MED ORDER — ASPIRIN 81 MG PO TBEC
81.0000 mg | DELAYED_RELEASE_TABLET | Freq: Every day | ORAL | 12 refills | Status: DC
  Filled 2023-03-01: qty 30, 30d supply, fill #0
  Filled 2023-03-29: qty 30, 30d supply, fill #1

## 2023-03-01 MED ORDER — NITROGLYCERIN 0.4 MG SL SUBL
0.8000 mg | SUBLINGUAL_TABLET | Freq: Once | SUBLINGUAL | Status: AC
  Administered 2023-03-01: 0.8 mg via SUBLINGUAL

## 2023-03-01 NOTE — TOC Initial Note (Signed)
 Transition of Care [facility]) - Initial/Assessment Note    Patient Details  Name: Carolyn Bryant MRN: [id number] Date of Birth: 02/08/1980  [facility] CM/SW Contact:    Jennett Model, RN Phone Number: 03/01/2023, 4:02 PM  Clinical Narrative:                 From home with son, has PCP  follow up on AVS and insurance on file, states has no HH services in place at this time or DME at home.  States boyfriend will transport them home at [employer] and he  is support system, states gets medications from [facility] on [address].  Pta self ambulatory .  Expected Discharge Plan: Home/Self Care Barriers to Discharge: No Barriers Identified   Patient Goals and CMS Choice Patient states their goals for this hospitalization and ongoing recovery are:: return home   Choice offered to / list presented to : NA      Expected Discharge Plan and Services In-house Referral: NA Discharge Planning Services: CM Consult Post Acute Care Choice: NA Living arrangements for the past 2 months: Single Family Home Expected Discharge Date: 03/01/23               DME Arranged: N/A DME Agency: NA       HH Arranged: NA          Prior Living Arrangements/Services Living arrangements for the past 2 months: Single Family Home Lives with:: Adult Children (son) Patient language and need for interpreter reviewed:: Yes Do you feel safe going back to the place where you live?: Yes      Need for Family Participation in Patient Care: No (Comment) Care giver support system in place?: No (comment)   Criminal Activity/Legal Involvement Pertinent to Current Situation/Hospitalization: No - Comment as needed  Activities of Daily Living   ADL Screening (condition at time of admission) Independently performs ADLs?: Yes (appropriate for developmental age) Is the patient deaf or have difficulty hearing?: No Does the patient have difficulty seeing, even when wearing glasses/contacts?: No Does the patient have  difficulty concentrating, remembering, or making decisions?: No  Permission Sought/Granted Permission sought to share information with : Case Manager Permission granted to share information with : Yes, Verbal Permission Granted              Emotional Assessment Appearance:: Appears stated age Attitude/Demeanor/Rapport: Engaged Affect (typically observed): Appropriate Orientation: : Oriented to Self, Oriented to Place, Oriented to  Time, Oriented to Situation   Psych Involvement: No (comment)  Admission diagnosis:  Elevated troponin [R79.89] NSTEMI (non-ST elevated myocardial infarction) (HCC) [I21.4] Chest pain, unspecified type [R07.9] Patient Active Problem List   Diagnosis Date Noted   Chest pain 03/01/2023   Elevated troponin 03/01/2023   NSTEMI (non-ST elevated myocardial infarction) (HCC) 02/27/2023   Unstable angina (HCC) 02/27/2023   Primary hypertension 02/27/2023   Nicotine abuse 02/27/2023   Digital mucinous cyst of finger 09/25/2022   Pain in finger of right hand 09/16/2022   Pain in right knee 07/04/2021   PCP:  Pcp, No Pharmacy:   [facility] [address]), [place] - [address] [place] ([place]) [place] [address] Phone: [phone] Fax: [phone]  [facility] [address] [place] [address] Phone: [phone] Fax: [phone]     Social Drivers of Health (SDOH) Social History: SDOH Screenings   Food Insecurity: No Food Insecurity (02/27/2023)  Housing: High Risk (02/27/2023)  Transportation Needs:  No Transportation Needs (02/27/2023)  Utilities: Not At Risk (02/27/2023)  Social Connections: Socially Isolated (02/27/2023)  Tobacco Use: High Risk (02/27/2023)   SDOH Interventions:     Readmission Risk Interventions     No data to display

## 2023-03-01 NOTE — TOC Transition Note (Signed)
 Transition of Care [facility]) - Discharge Note   Patient Details  Name: Carolyn Bryant MRN: [id number] Date of Birth: 1980-07-11  [facility]) CM/SW Contact:  Jennett Model, RN Phone Number: 03/01/2023, 4:03 PM   Clinical Narrative:    For dc today, boyfriend at bedside to transport home.    Final next level of care: Home/Self Care Barriers to Discharge: No Barriers Identified   Patient Goals and CMS Choice Patient states their goals for this hospitalization and ongoing recovery are:: return home   Choice offered to / list presented to : NA      Discharge Placement                       Discharge Plan and Services Additional resources added to the After Visit Summary for   In-house Referral: NA Discharge Planning Services: CM Consult Post Acute Care Choice: NA          DME Arranged: N/A DME Agency: NA       HH Arranged: NA          Social Drivers of Health (SDOH) Interventions SDOH Screenings   Food Insecurity: No Food Insecurity (02/27/2023)  Housing: High Risk (02/27/2023)  Transportation Needs: No Transportation Needs (02/27/2023)  Utilities: Not At Risk (02/27/2023)  Social Connections: Socially Isolated (02/27/2023)  Tobacco Use: High Risk (02/27/2023)     Readmission Risk Interventions     No data to display

## 2023-03-01 NOTE — Progress Notes (Addendum)
 Rounding Note    Patient Name: TIMMESHA BLOOR Date of Encounter: 03/01/2023  [facility] Cardiologist: Sheryle Donning, MD   Subjective   Just came back from cardiac CT, pending results. Has not had chest pain. Reviewed potential outcomes/management  Inpatient Medications    Scheduled Meds:  aspirin  EC  81 mg Oral Daily   atorvastatin   80 mg Oral Daily   isosorbide  mononitrate  15 mg Oral Daily   Continuous Infusions:  PRN Meds: acetaminophen , albuterol    Vital Signs    Vitals:   03/01/23 0507 03/01/23 0723 03/01/23 1000 03/01/23 1112  BP: 117/89 139/86 (!) 145/93 122/80  Pulse: 78 72  66  Resp: 20 18  17   Temp: 97.8 F (36.6 C) 98.2 F (36.8 C)  98.3 F (36.8 C)  TempSrc: Oral Oral  Oral  SpO2: 100% 100%  100%  Weight: 51.2 kg     Height:        Intake/Output Summary (Last 24 hours) at 03/01/2023 1508 Last data filed at 03/01/2023 1114 Gross per 24 hour  Intake 240 ml  Output 400 ml  Net -160 ml      03/01/2023    5:07 AM 02/27/2023    3:55 PM 02/27/2023   10:25 AM  Last 3 Weights  Weight (lbs) 112 lb 14.4 oz 134 lb 14.7 oz 134 lb 14.7 oz  Weight (kg) 51.211 kg 61.2 kg 61.2 kg      Telemetry    SR - Personally Reviewed  Physical Exam   GEN: No acute distress.   Neck: No JVD Cardiac: RRR, no murmurs, rubs, or gallops.  Respiratory: Clear to auscultation bilaterally. GI: Soft, nontender, non-distended  MS: No edema; No deformity. Neuro:  Nonfocal  Psych: Normal affect   New pertinent results (labs, ECG, imaging, cardiac studies)    Echo with EF of 60 to 65%, no wall motion abnormalities, no significant valve disease  Patient Profile     43 y.o. female with past medical history of tobacco abuse resenting with acute chest pain, diaphoresis, nausea, and vomiting.  Lab workup notable for elevated troponins  Assessment & Plan    Chest pain Elevated troponin -Was started on heparin  drip but had acute drop in hemoglobin  without obvious bleeding. -Weekend team had planned for coronary CT with drop in her hemoglobin, performed this AM, awaiting results -No labs ordered this morning, I have ordered CBC and bmet UPDATE: Hgb 7.4 this AM, stable from yesterday but down from her baseline of 9 -With her symptoms, elevated troponins, and risk factor of possible untreated hypertension and tobacco abuse, if CT coronary with high risk lesion, would plan for cath. If no high risk lesion, we discussed that this could have been spasm -Continue aspirin , atorvastatin  -Continue Imdur  -tobacco cessation counseling done. She states that she will no longer be smoking since this event  Hypercholesterolemia -LDL 155, started statin this admission, recheck on follow up -lp(a) still pending  Anemia, iron  deficiency -stable today, but with low MCV, chronic low Hgb, low ferritin, consistent with iron  deficiency -was on oral iron  as an outpatient, but given her numbers, would benefit from IV iron  as an outpatient if this can be arranged by her PCP  Next steps pending results of CT, as above.  ADDENDED TO ADD: CT images personally reviewed, and report/FFR also reviewed. 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 I discussed results with her, importance of medications, tobacco avoidance, etc. Continue medical management as above. We will  get her outpatient follow up with cardiology.   We will discharge her, on the current meds as listed. 40 minutes spent by me (MD) in preparation for discharge.    Signed, Sheryle Donning, MD  03/01/2023, 3:08 PM

## 2023-03-01 NOTE — Discharge Summary (Signed)
 Discharge Summary    Patient ID: Carolyn Bryant MRN: [id number]; DOB: 1980-11-16  Admit date: 02/27/2023 Discharge date: 03/01/2023  PCP:  Pcp, No    [facility] Providers Cardiologist:  Sheryle Donning, MD        Discharge Diagnoses    Principal Problem:   NSTEMI (non-ST elevated myocardial infarction) [facility]) Active Problems:   Unstable angina (HCC)   Primary hypertension   Nicotine abuse   Chest pain   Elevated troponin    Diagnostic Studies/Procedures    Cardiac Studies & Procedures      ECHOCARDIOGRAM  ECHOCARDIOGRAM COMPLETE 02/28/2023  Narrative ECHOCARDIOGRAM REPORT    Patient Name:   Carolyn Bryant Date of Exam: 02/28/2023 Medical Rec #:  [id number]       Height:       62.0 in Accession #:    [id number]      Weight:       134.9 lb Date of Birth:  1980/03/26        BSA:          1.617 m Patient Age:    42 years        BP:           128/92 mmHg Patient Gender: F               HR:           65 bpm. Exam Location:  Inpatient  Procedure: 2D Echo, Cardiac Doppler and Color Doppler  Indications:    NSTEMI  History:        Patient has no prior history of Echocardiogram examinations. Risk Factors:Current Smoker.  Sonographer:    Dione Franks RDCS Referring Phys: [id number] RAJIV C PATEL   Sonographer Comments: Global longitudinal strain was attempted. IMPRESSIONS   1. Left ventricular ejection fraction, by estimation, is 60 to 65%. The left ventricle has normal function. The left ventricle has no regional wall motion abnormalities. Left ventricular diastolic parameters were normal. 2. Right ventricular systolic function is normal. The right ventricular size is normal. There is normal pulmonary artery systolic pressure. 3. Left atrial size was mildly dilated. 4. The mitral valve is normal in structure. Mild mitral valve regurgitation. No evidence of mitral stenosis. 5. The aortic valve is tricuspid. Aortic valve regurgitation is not  visualized. No aortic stenosis is present. 6. The inferior vena cava is normal in size with greater than 50% respiratory variability, suggesting right atrial pressure of 3 mmHg.  Comparison(s): No prior Echocardiogram.  FINDINGS Left Ventricle: Left ventricular ejection fraction, by estimation, is 60 to 65%. The left ventricle has normal function. The left ventricle has no regional wall motion abnormalities. The left ventricular internal cavity size was normal in size. There is no left ventricular hypertrophy. Left ventricular diastolic parameters were normal.  Right Ventricle: The right ventricular size is normal. No increase in right ventricular wall thickness. Right ventricular systolic function is normal. There is normal pulmonary artery systolic pressure. The tricuspid regurgitant velocity is 1.95 m/s, and with an assumed right atrial pressure of 3 mmHg, the estimated right ventricular systolic pressure is 18.2 mmHg.  Left Atrium: Left atrial size was mildly dilated.  Right Atrium: Right atrial size was normal in size.  Pericardium: There is no evidence of pericardial effusion.  Mitral Valve: The mitral valve is normal in structure. Mild mitral valve regurgitation. No evidence of mitral valve stenosis.  Tricuspid Valve: The tricuspid valve is normal in structure. Tricuspid valve regurgitation is mild .  No evidence of tricuspid stenosis.  Aortic Valve: The aortic valve is tricuspid. Aortic valve regurgitation is not visualized. No aortic stenosis is present.  Pulmonic Valve: The pulmonic valve was normal in structure. Pulmonic valve regurgitation is trivial. No evidence of pulmonic stenosis.  Aorta: The aortic root and ascending aorta are structurally normal, with no evidence of dilitation.  Venous: The inferior vena cava is normal in size with greater than 50% respiratory variability, suggesting right atrial pressure of 3 mmHg.  IAS/Shunts: No atrial level shunt detected by color  flow Doppler.   LEFT VENTRICLE PLAX 2D LVIDd:         4.20 cm   Diastology LVIDs:         2.70 cm   LV e' medial:    8.92 cm/s LV PW:         1.20 cm   LV E/e' medial:  6.3 LV IVS:        0.90 cm   LV e' lateral:   13.30 cm/s LVOT diam:     1.80 cm   LV E/e' lateral: 4.2 LV SV:         55 LV SV Index:   34 LVOT Area:     2.54 cm   RIGHT VENTRICLE             IVC RV Basal diam:  2.60 cm     IVC diam: 2.10 cm RV S prime:     13.40 cm/s TAPSE (M-mode): 2.5 cm  LEFT ATRIUM             Index        RIGHT ATRIUM           Index LA diam:        3.10 cm 1.92 cm/m   RA Area:     12.90 cm LA Vol (A2C):   52.4 ml 32.40 ml/m  RA Volume:   32.60 ml  20.16 ml/m LA Vol (A4C):   52.8 ml 32.65 ml/m LA Biplane Vol: 55.2 ml 34.13 ml/m AORTIC VALVE LVOT Vmax:   113.00 cm/s LVOT Vmean:  69.700 cm/s LVOT VTI:    0.217 m  AORTA Ao Root diam: 2.40 cm Ao Asc diam:  2.50 cm  MITRAL VALVE               TRICUSPID VALVE MV Area (PHT): 2.62 cm    TR Peak grad:   15.2 mmHg MV Decel Time: 289 msec    TR Vmax:        195.00 cm/s MV E velocity: 56.20 cm/s MV A velocity: 41.90 cm/s  SHUNTS MV E/A ratio:  1.34        Systemic VTI:  0.22 m Systemic Diam: 1.80 cm  Vishnu Priya Mallipeddi Electronically signed by Lucetta Russel Mallipeddi Signature Date/Time: 02/28/2023/3:22:25 PM    Final    CT SCANS  CT CORONARY MORPH W/CTA COR W/SCORE 03/01/2023  Narrative HISTORY: 43 yo female with chest pain/anginal equiv, ECGs or troponins abnormal  EXAM: Cardiac/Coronary CTA  TECHNIQUE: The patient was scanned on a [employer].  PROTOCOL: A 100 kV prospective scan was triggered in the descending thoracic aorta at 111 HU's. Axial non-contrast 3 mm slices were carried out through the heart. The data set was analyzed on a dedicated work station and scored using the Agatson method. Gantry rotation speed was 250 msecs and collimation was .6 mm. Beta blockade and 0.8 mg of sl NTG was  given. The 3D data  set was reconstructed in 5% intervals of the 35-75 % of the R-R cycle. Diastolic phases were analyzed on a dedicated work station using MPR, MIP and VRT modes. The patient received 95mL OMNIPAQUE  IOHEXOL  350 MG/ML SOLN contrast.  FINDINGS: Quality: Fair, HR 62, attenuation artifact  Coronary calcium  score: The patient's coronary artery calcium  score is 0.  Coronary arteries: Normal coronary origins.  Right dominance.  Right Coronary Artery: Dominant. No disease. Normal R-PLB and R-PDA branches.  Left Main Coronary Artery: Normal. Bifurcates into the LAD and LCx arteries.  Left Anterior Descending Coronary Artery: Large anterior artery that wraps around the apex. Mild to low moderate non-calcified proximal stenosis (50-69%). D1 branch with mild 25-49% proximal non-calcified stenosis. Large D2 branch with minimal proximal non-calcified 1-24% stenosis.  Left Circumflex Artery: AV groove vessel, no disease. Large mid-vessel OM branch, no disease.  Aorta: Normal size, 24 mm at the mid ascending aorta (level of the PA bifurcation) measured double oblique. No calcifications. No dissection.  Aortic Valve: Trileaflet. No calcifications.  Other findings:  Normal pulmonary vein drainage into the left atrium.  Normal left atrial appendage without a thrombus.  Normal size of the pulmonary artery.  PLAQUE ANALYSIS: Total plaque volume is 87 mm3 (mild). All of the plaque is non-calcified with 1% being low attenuation. Plaque is noted in the mid-RCA and LAD/diagonal branches.  IMPRESSION: 1. 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 FFR will be submitted for analysis and reported separately.  2. Coronary artery calcium  score is 0.  3. Normal coronary origin with right dominance.  4. Total plaque volume is 87 mm3 (mild). All of the plaque is non-calcified with 1% being low attenuation. Plaque is noted in the mid-RCA and LAD/diagonal  branches.  5. Cardiovascular risk factor modification is recommended.   Electronically Signed By: Hazle Lites M.D. On: 03/01/2023 13:46          _____________   History of Present Illness     Reigan Moala Eastridge is a 43 y.o. female with history of hypertension, tobacco use who presented to the ED with chest/should/back pain that awoke her from sleep. This pain was associated with diaphoresis, nausea, vomiting. No palpitations, syncope, leg swelling prior to admission.   Hospital Course     Consultants: n/a   NSTEMI  Patient admitted with chest pain radiating to shoulder and back was found to have troponin elevated: 36->75->68->70. She was initially treated with heparin  and plan for [facility] but patient subsequently had acute drop in hemoglobin without obvious bleed. Given this decrease, heparin  stopped and plans made instead for cardiac CTA. This study was completed today. Patient with nonobstructive CAD (mild to moderate non-obstructive non-calcified plaque), negative by FFR. Symptoms of chest pain could have been secondary to coronary vasospasm vs hypertensive urgency.   Continue ASA, Lipitor, Imdur . Tobacco cessation discussed with patient who plans to quit.  Close outpatient follow up arranged.   Hypercholesterolemia  Patient with LDL 155 this admission. LPA pending.  Continue Lipitor 80mg  as started this admission. Will need repeat lipid panel and LFTs in ~8 weeks.   Acute on chronic iron  deficiency anemia  HGB at baseline appears to be around 9. This admission following initiation of heparin , she had decrease to 7.4. No obvious signs of bleeding. Hemoglobin has since stabilized, 7.5->7.4. CBC shows MCV 74. Ferritin 1 with iron  saturation ratio of 8.   Continue oral iron  replacement on discharge. Would suggest consideration of IV iron  with PCP for better absorption/replacement.  Close monitoring for signs of bleeding discussed.       Did the patient have an acute coronary  syndrome (MI, NSTEMI, STEMI, etc) this admission?:  Yes                               AHA/ACC ACS Clinical Performance & Quality Measures: Aspirin  prescribed? - Yes ADP Receptor Inhibitor (Plavix/Clopidogrel, Brilinta/Ticagrelor or Effient/Prasugrel) prescribed (includes medically managed patients)? - No - non-obstructive CAD by cardiac CTA Beta Blocker prescribed? - No. The patient has an EF >/= 50%. Based upon the [facility] Study pub in Apr 2024, there is no benefit in patients with preserved EF post MI. High Intensity Statin (Lipitor 40-80mg  or Crestor 20-40mg ) prescribed? - Yes EF assessed during THIS hospitalization? - Yes For EF <40%, was ACEI/ARB prescribed? - Not Applicable (EF >/= 40%) For EF <40%, Aldosterone Antagonist (Spironolactone or Eplerenone) prescribed? - Not Applicable (EF >/= 40%) Cardiac Rehab Phase II ordered (including medically managed patients)? - Yes       The patient will be scheduled for a TOC follow up appointment in the next 14 days.  A message has been sent to the [facility] and Scheduling Pool at the office where the patient should be seen for follow up.  _____________  Discharge Vitals Blood pressure 122/80, pulse 66, temperature 98.3 F (36.8 C), temperature source Oral, resp. rate 17, height 5\' 2"  (1.575 m), weight 51.2 kg, last menstrual period 01/16/2023, SpO2 100%.  Filed Weights   02/27/23 1025 02/27/23 1555 03/01/23 0507  Weight: 61.2 kg 61.2 kg 51.2 kg    Labs & Radiologic Studies    CBC Recent Labs    02/28/23 0811 03/01/23 0906  WBC 4.0 4.2  HGB 7.5* 7.4*  HCT 24.1* 23.6*  MCV 75.8* 74.0*  PLT 370 373   Basic Metabolic Panel Recent Labs    16/10/96 0300 03/01/23 0906  NA 138 139  K 3.5 3.8  CL 107 108  CO2 21* 21*  GLUCOSE 106* 91  BUN 6 5*  CREATININE 0.73 0.64  CALCIUM  8.6* 9.0   Liver Function Tests Recent Labs    02/27/23 1035 02/28/23 0300  AST 14* 12*  ALT 9 8  ALKPHOS 41 35*  BILITOT 0.4 0.4  PROT 7.4 6.0*   ALBUMIN 3.7 3.0*   Recent Labs    02/27/23 1035  LIPASE 23   High Sensitivity Troponin:   Recent Labs  Lab 02/27/23 1035 02/27/23 1306 02/28/23 0921 02/28/23 1122  TROPONINIHS 36* 75* 68* 70*    BNP Invalid input(s): "POCBNP" D-Dimer No results for input(s): "DDIMER" in the last 72 hours. Hemoglobin A1C Recent Labs    02/28/23 0300  HGBA1C 5.9*   Fasting Lipid Panel Recent Labs    02/28/23 0300  CHOL 230*  HDL 55  LDLCALC 155*  TRIG 102  CHOLHDL 4.2   Thyroid Function Tests No results for input(s): "TSH", "T4TOTAL", "T3FREE", "THYROIDAB" in the last 72 hours.  Invalid input(s): "FREET3" _____________  CT CORONARY FRACTIONAL FLOW RESERVE FLUID ANALYSIS Result Date: 03/01/2023 EXAM: CT FFR ANALYSIS CLINICAL DATA:  chest pain FINDINGS: FFRct analysis was performed on the original cardiac CT angiogram dataset. Diagrammatic representation of the FFRct analysis is provided in a separate PDF document in PACS. This dictation was created using the PDF document and an interactive 3D model of the results. 3D model is not available in the EMR/PACS. Normal FFR range is >0.80. 1. Left  Main:  No significant stenosis. FFR = 1.00 2. LAD: No significant stenosis. Proximal FFR = 0.96, Mid FFR = 0.91, Distal FFR = 0.88 3. LCX: No significant stenosis. Proximal FFR = 0.99, Distal FFR = 0.92 4. RCA: No significant stenosis. Proximal FFR = 0.99, Mid FFR = 0.92, Distal FFR = 0.90 IMPRESSION: 1. CT FFR analysis did not show any significant discrete stenosis (<0.75). Electronically Signed   By: Hazle Lites M.D.   On: 03/01/2023 13:48   CT CORONARY MORPH W/CTA COR W/SCORE W/CA W/CM &/OR WO/CM Result Date: 03/01/2023 HISTORY: 43 yo female with chest pain/anginal equiv, ECGs or troponins abnormal EXAM: Cardiac/Coronary CTA TECHNIQUE: The patient was scanned on a [employer]. PROTOCOL: A 100 kV prospective scan was triggered in the descending thoracic aorta at 111 HU's. Axial  non-contrast 3 mm slices were carried out through the heart. The data set was analyzed on a dedicated work station and scored using the Agatson method. Gantry rotation speed was 250 msecs and collimation was .6 mm. Beta blockade and 0.8 mg of sl NTG was given. The 3D data set was reconstructed in 5% intervals of the 35-75 % of the R-R cycle. Diastolic phases were analyzed on a dedicated work station using MPR, MIP and VRT modes. The patient received 95mL OMNIPAQUE  IOHEXOL  350 MG/ML SOLN contrast. FINDINGS: Quality: Fair, HR 62, attenuation artifact Coronary calcium  score: The patient's coronary artery calcium  score is 0. Coronary arteries: Normal coronary origins.  Right dominance. Right Coronary Artery: Dominant. No disease. Normal R-PLB and R-PDA branches. Left Main Coronary Artery: Normal. Bifurcates into the LAD and LCx arteries. Left Anterior Descending Coronary Artery: Large anterior artery that wraps around the apex. Mild to low moderate non-calcified proximal stenosis (50-69%). D1 branch with mild 25-49% proximal non-calcified stenosis. Large D2 branch with minimal proximal non-calcified 1-24% stenosis. Left Circumflex Artery: AV groove vessel, no disease. Large mid-vessel OM branch, no disease. Aorta: Normal size, 24 mm at the mid ascending aorta (level of the PA bifurcation) measured double oblique. No calcifications. No dissection. Aortic Valve: Trileaflet. No calcifications. Other findings: Normal pulmonary vein drainage into the left atrium. Normal left atrial appendage without a thrombus. Normal size of the pulmonary artery. PLAQUE ANALYSIS: Total plaque volume is 87 mm3 (mild). All of the plaque is non-calcified with 1% being low attenuation. Plaque is noted in the mid-RCA and LAD/diagonal branches. IMPRESSION: 1. 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 FFR will be submitted for analysis and reported separately. 2. Coronary artery calcium  score is 0. 3. Normal coronary origin  with right dominance. 4. Total plaque volume is 87 mm3 (mild). All of the plaque is non-calcified with 1% being low attenuation. Plaque is noted in the mid-RCA and LAD/diagonal branches. 5. Cardiovascular risk factor modification is recommended. Electronically Signed   By: Hazle Lites M.D.   On: 03/01/2023 13:46   ECHOCARDIOGRAM COMPLETE Result Date: 02/28/2023    ECHOCARDIOGRAM REPORT   Patient Name:   Carolyn Bryant Date of Exam: 02/28/2023 Medical Rec #:  [id number]       Height:       62.0 in Accession #:    [id number]      Weight:       134.9 lb Date of Birth:  [birth year]        BSA:          1.617 m Patient Age:    42 years        BP:  128/92 mmHg Patient Gender: F               HR:           65 bpm. Exam Location:  Inpatient Procedure: 2D Echo, Cardiac Doppler and Color Doppler Indications:    NSTEMI  History:        Patient has no prior history of Echocardiogram examinations.                 Risk Factors:Current Smoker.  Sonographer:    Dione Franks RDCS Referring Phys: [id number] RAJIV C PATEL  Sonographer Comments: Global longitudinal strain was attempted. IMPRESSIONS  1. Left ventricular ejection fraction, by estimation, is 60 to 65%. The left ventricle has normal function. The left ventricle has no regional wall motion abnormalities. Left ventricular diastolic parameters were normal.  2. Right ventricular systolic function is normal. The right ventricular size is normal. There is normal pulmonary artery systolic pressure.  3. Left atrial size was mildly dilated.  4. The mitral valve is normal in structure. Mild mitral valve regurgitation. No evidence of mitral stenosis.  5. The aortic valve is tricuspid. Aortic valve regurgitation is not visualized. No aortic stenosis is present.  6. The inferior vena cava is normal in size with greater than 50% respiratory variability, suggesting right atrial pressure of 3 mmHg. Comparison(s): No prior Echocardiogram. FINDINGS  Left Ventricle: Left  ventricular ejection fraction, by estimation, is 60 to 65%. The left ventricle has normal function. The left ventricle has no regional wall motion abnormalities. The left ventricular internal cavity size was normal in size. There is  no left ventricular hypertrophy. Left ventricular diastolic parameters were normal. Right Ventricle: The right ventricular size is normal. No increase in right ventricular wall thickness. Right ventricular systolic function is normal. There is normal pulmonary artery systolic pressure. The tricuspid regurgitant velocity is 1.95 m/s, and  with an assumed right atrial pressure of 3 mmHg, the estimated right ventricular systolic pressure is 18.2 mmHg. Left Atrium: Left atrial size was mildly dilated. Right Atrium: Right atrial size was normal in size. Pericardium: There is no evidence of pericardial effusion. Mitral Valve: The mitral valve is normal in structure. Mild mitral valve regurgitation. No evidence of mitral valve stenosis. Tricuspid Valve: The tricuspid valve is normal in structure. Tricuspid valve regurgitation is mild . No evidence of tricuspid stenosis. Aortic Valve: The aortic valve is tricuspid. Aortic valve regurgitation is not visualized. No aortic stenosis is present. Pulmonic Valve: The pulmonic valve was normal in structure. Pulmonic valve regurgitation is trivial. No evidence of pulmonic stenosis. Aorta: The aortic root and ascending aorta are structurally normal, with no evidence of dilitation. Venous: The inferior vena cava is normal in size with greater than 50% respiratory variability, suggesting right atrial pressure of 3 mmHg. IAS/Shunts: No atrial level shunt detected by color flow Doppler.  LEFT VENTRICLE PLAX 2D LVIDd:         4.20 cm   Diastology LVIDs:         2.70 cm   LV e' medial:    8.92 cm/s LV PW:         1.20 cm   LV E/e' medial:  6.3 LV IVS:        0.90 cm   LV e' lateral:   13.30 cm/s LVOT diam:     1.80 cm   LV E/e' lateral: 4.2 LV SV:         55  LV SV Index:   34  LVOT Area:     2.54 cm  RIGHT VENTRICLE             IVC RV Basal diam:  2.60 cm     IVC diam: 2.10 cm RV S prime:     13.40 cm/s TAPSE (M-mode): 2.5 cm LEFT ATRIUM             Index        RIGHT ATRIUM           Index LA diam:        3.10 cm 1.92 cm/m   RA Area:     12.90 cm LA Vol (A2C):   52.4 ml 32.40 ml/m  RA Volume:   32.60 ml  20.16 ml/m LA Vol (A4C):   52.8 ml 32.65 ml/m LA Biplane Vol: 55.2 ml 34.13 ml/m  AORTIC VALVE LVOT Vmax:   113.00 cm/s LVOT Vmean:  69.700 cm/s LVOT VTI:    0.217 m  AORTA Ao Root diam: 2.40 cm Ao Asc diam:  2.50 cm MITRAL VALVE               TRICUSPID VALVE MV Area (PHT): 2.62 cm    TR Peak grad:   15.2 mmHg MV Decel Time: 289 msec    TR Vmax:        195.00 cm/s MV E velocity: 56.20 cm/s MV A velocity: 41.90 cm/s  SHUNTS MV E/A ratio:  1.34        Systemic VTI:  0.22 m                            Systemic Diam: 1.80 cm Vishnu Priya Mallipeddi Electronically signed by Lucetta Russel Mallipeddi Signature Date/Time: 02/28/2023/3:22:25 PM    Final    DG Chest 2 View Result Date: 02/27/2023 CLINICAL DATA:  Chest pain, shortness of breath, and cough. EXAM: CHEST - 2 VIEW COMPARISON:  Chest x-ray dated February 18, 2023. FINDINGS: The heart size and mediastinal contours are within normal limits. Both lungs are clear. The visualized skeletal structures are unremarkable. IMPRESSION: No active cardiopulmonary disease. Electronically Signed   By: Aleta Anda M.D.   On: 02/27/2023 11:07   DG Chest 2 View Result Date: 02/18/2023 CLINICAL DATA:  Chest pain EXAM: CHEST - 2 VIEW COMPARISON:  12/13/2022 FINDINGS: Heart size and mediastinal contours are unremarkable. No pleural fluid, interstitial edema or airspace consolidation. The visualized osseous structures appear intact. None IMPRESSION: No active cardiopulmonary disease. Electronically Signed   By: Kimberley Penman M.D.   On: 02/18/2023 09:51   Disposition   Pt is being discharged home today in good  condition.  Follow-up Plans & Appointments        Discharge Medications   Allergies as of 03/01/2023       Reactions   Peanuts [peanut Oil] Hives, Swelling   Reaction: throat swelling   Prednisone  Rash   Has had it since, Tolerated.        Medication List     STOP taking these medications    amoxicillin  500 MG capsule Commonly known as: AMOXIL    ibuprofen  200 MG tablet Commonly known as: ADVIL    meloxicam  15 MG tablet Commonly known as: Mobic    penicillin  v potassium 500 MG tablet Commonly known as: VEETID       TAKE these medications    acetaminophen  500 MG tablet Commonly known as: TYLENOL  Take 1 tablet (500 mg total) by mouth every 6 (six)  hours as needed.   albuterol  108 (90 Base) MCG/ACT inhaler Commonly known as: VENTOLIN  HFA Inhale 2 puffs into the lungs every 6 (six) hours as needed for wheezing or shortness of breath.   aspirin  EC 81 MG tablet Take 1 tablet (81 mg total) by mouth daily. Swallow whole. Start taking on: March 02, 2023   atorvastatin  80 MG tablet Commonly known as: LIPITOR Take 1 tablet (80 mg total) by mouth daily. Start taking on: March 02, 2023   benzonatate  100 MG capsule Commonly known as: TESSALON  Take 1 capsule (100 mg total) by mouth 3 (three) times daily as needed for cough.   FeroSul 325 (65 Fe) MG tablet Generic drug: ferrous sulfate  Take 325 mg by mouth daily.   isosorbide  mononitrate 30 MG 24 hr tablet Commonly known as: IMDUR  Take 0.5 tablets (15 mg total) by mouth daily. Start taking on: March 02, 2023   lidocaine  2 % solution Commonly known as: XYLOCAINE  Use as directed 15 mLs in the mouth or throat every 4 (four) hours as needed for mouth pain. Swish/gargle and spit   ondansetron  4 MG tablet Commonly known as: Zofran  Take 1 tablet (4 mg total) by mouth every 8 (eight) hours as needed for nausea or vomiting.   predniSONE  20 MG tablet Commonly known as: DELTASONE  Take 2 tablets (40 mg  total) by mouth daily.   Ultram  50 MG tablet Generic drug: traMADol  Take 50 mg by mouth as directed.           Outstanding Labs/Studies    Duration of Discharge Encounter: APP Time: 30 minutes   Signed, Leala Prince, PA-C 03/01/2023, 3:15 PM

## 2023-03-01 NOTE — Progress Notes (Signed)
CT sent for FFR 

## 2023-03-01 NOTE — Plan of Care (Signed)
   Problem: Clinical Measurements: Goal: Ability to maintain clinical measurements within normal limits will improve Outcome: Progressing   Problem: Clinical Measurements: Goal: Will remain free from infection Outcome: Progressing   Problem: Clinical Measurements: Goal: Diagnostic test results will improve Outcome: Progressing

## 2023-03-17 ENCOUNTER — Ambulatory Visit (INDEPENDENT_AMBULATORY_CARE_PROVIDER_SITE_OTHER): Payer: PRIVATE HEALTH INSURANCE | Admitting: Cardiology

## 2023-03-17 ENCOUNTER — Encounter (HOSPITAL_BASED_OUTPATIENT_CLINIC_OR_DEPARTMENT_OTHER): Payer: Self-pay | Admitting: Cardiology

## 2023-03-17 VITALS — BP 116/70 | HR 74 | Ht 62.0 in | Wt 116.8 lb

## 2023-03-17 DIAGNOSIS — Z716 Tobacco abuse counseling: Secondary | ICD-10-CM

## 2023-03-17 DIAGNOSIS — I2510 Atherosclerotic heart disease of native coronary artery without angina pectoris: Secondary | ICD-10-CM | POA: Diagnosis not present

## 2023-03-17 DIAGNOSIS — E7800 Pure hypercholesterolemia, unspecified: Secondary | ICD-10-CM | POA: Diagnosis not present

## 2023-03-17 DIAGNOSIS — Z09 Encounter for follow-up examination after completed treatment for conditions other than malignant neoplasm: Secondary | ICD-10-CM

## 2023-03-17 DIAGNOSIS — E7841 Elevated Lipoprotein(a): Secondary | ICD-10-CM | POA: Diagnosis not present

## 2023-03-17 DIAGNOSIS — F17200 Nicotine dependence, unspecified, uncomplicated: Secondary | ICD-10-CM

## 2023-03-17 NOTE — Progress Notes (Signed)
  Cardiology Office Note:  .   Date:  03/17/2023  ID:  Carolyn Bryant, DOB August 28, 1980, MRN [id number] PCP: Aviva Kluver  [facility] Providers Cardiologist:  Jodelle Red, MD {  History of Present Illness: .   Carolyn Bryant is a 43 y.o. female with PMH CAD, hypercholesterolemia, elevated lp(a), tobacco use.  Pertinent CV history: I met her 02/2023 during her hospitalization for chest pain and elevated troponin. Echo unremarkable. Coronary CT with mild to moderate CAD in proximal LAD, otherwise minimal disease, Ca score 0, FFR negative.   Today: Has been back to work, feels tired but otherwise doing well. No major symptoms since she has been home. Reviewed her CT, meds, labs.  Cutting back on smoking, now down to 1 pack/week.  ROS: Denies chest pain, shortness of breath at rest or with normal exertion. No PND, orthopnea, LE edema or unexpected weight gain. No syncope or palpitations. ROS otherwise negative except as noted.   Studies Reviewed: Marland Kitchen    EKG:       Physical Exam:   VS:  BP 116/70   Pulse 74   Ht 5\' 2"  (1.575 m)   Wt 116 lb 12.8 oz (53 kg)   LMP 01/16/2023 (Exact Date)   SpO2 98%   BMI 21.36 kg/m    Wt Readings from Last 3 Encounters:  03/17/23 116 lb 12.8 oz (53 kg)  03/01/23 112 lb 14.4 oz (51.2 kg)  12/13/22 135 lb (61.2 kg)    GEN: Well nourished, well developed in no acute distress HEENT: Normal, moist mucous membranes NECK: No JVD CARDIAC: regular rhythm, normal S1 and S2, no rubs or gallops. No murmur. VASCULAR: Radial and DP pulses 2+ bilaterally. No carotid bruits RESPIRATORY:  Clear to auscultation without rales, wheezing or rhonchi  ABDOMEN: Soft, non-tender, non-distended MUSCULOSKELETAL:  Ambulates independently SKIN: Warm and dry, no edema NEUROLOGIC:  Alert and oriented x 3. No focal neuro deficits noted. PSYCHIATRIC:  Normal affect    ASSESSMENT AND PLAN: .    Focal LAD CAD Hospitalization with chest pain/elevated  troponin -reviewed her recent hospitalization, discharge medications/instructions -Continue aspirin 81 mg daily, atorvastatin 80 mg daily -recheck lipids 3 mos -Continue Imdur 15 mg daily  Tobacco counseling The patient was counseled on tobacco cessation today for 4 minutes.  Counseling included reviewing the risks of smoking tobacco products, how it impacts the patient's current medical diagnoses and different strategies for quitting.  Pharmacotherapy to aid in tobacco cessation was not prescribed today.    Hypercholesterolemia -LDL 155, started statin this admission, recheck on follow up -lp(a) elevated at 111.4   Anemia, iron deficiency -stable today, but with low MCV, chronic low Hgb, low ferritin, consistent with iron deficiency -establishing care with PCP on 03/23/13.  CV risk counseling and prevention -recommend heart healthy/Mediterranean diet, with whole grains, fruits, vegetable, fish, lean meats, nuts, and olive oil. Limit salt. -recommend moderate walking, 3-5 times/week for 30-50 minutes each session. Aim for at least 150 minutes.week. Goal should be pace of 3 miles/hours, or walking 1.5 miles in 30 minutes -recommend avoidance of tobacco products. Avoid excess alcohol.  Dispo: labs in 3 mos, follow up 6 mos  Signed, Jodelle Red, MD   Jodelle Red, MD, PhD, [facility]  [facility]  [place]  [facility] at [facility] at [facility] [address], [address] [place], [place] [address] [phone]

## 2023-03-17 NOTE — Patient Instructions (Signed)
 Medication Instructions:  No changes *If you need a refill on your cardiac medications before your next appointment, please call your pharmacy*  Lab Work: In 3 month we will need to draw a fasting lipid panel. If you have labs (blood work) drawn today and your tests are completely normal, you will receive your results only by: MyChart Message (if you have MyChart) OR A paper copy in the mail If you have any lab test that is abnormal or we need to change your treatment, we will call you to review the results.  Testing/Procedures: No testing  Follow-Up: At [facility], you and your health needs are our priority.  As part of our continuing mission to provide you with exceptional heart care, we have created designated Provider Care Teams.  These Care Teams include your primary Cardiologist (physician) and Advanced Practice Providers (APPs -  Physician Assistants and Nurse Practitioners) who all work together to provide you with the care you need, when you need it.  We recommend signing up for the patient portal called "MyChart".  Sign up information is provided on this After Visit Summary.  MyChart is used to connect with patients for Virtual Visits (Telemedicine).  Patients are able to view lab/test results, encounter notes, upcoming appointments, etc.  Non-urgent messages can be sent to your provider as well.   To learn more about what you can do with MyChart, go to [email or web address].    Your next appointment:   6 month(s)  Provider:   Jodelle Red, MD

## 2023-03-24 ENCOUNTER — Ambulatory Visit (INDEPENDENT_AMBULATORY_CARE_PROVIDER_SITE_OTHER): Payer: PRIVATE HEALTH INSURANCE | Admitting: Family

## 2023-03-24 ENCOUNTER — Encounter: Payer: Self-pay | Admitting: Family

## 2023-03-24 VITALS — BP 133/80 | HR 81 | Temp 100.7°F | Ht 62.0 in | Wt 116.6 lb

## 2023-03-24 DIAGNOSIS — R0989 Other specified symptoms and signs involving the circulatory and respiratory systems: Secondary | ICD-10-CM

## 2023-03-24 DIAGNOSIS — Z7689 Persons encountering health services in other specified circumstances: Secondary | ICD-10-CM | POA: Diagnosis not present

## 2023-03-24 LAB — POCT RAPID STREP A (OFFICE): Rapid Strep A Screen: NEGATIVE

## 2023-03-24 MED ORDER — PREDNISONE 10 MG PO TABS: ORAL_TABLET | ORAL | 0 refills | Status: AC

## 2023-03-24 NOTE — Progress Notes (Signed)
 Subjective:    Carolyn Bryant - 43 y.o. female MRN [id number]  Date of birth: 1980/11/12  HPI  Carolyn Bryant is to establish care. She is accompanied by her significant other.  Current issues and/or concerns: - Reports congestion, headache, and leg aches for 2 days. Denies red flag symptoms. Taking over-the-counter medication with minimal relief. States she was recently around her manager at work who was sick.  - No further issues/concerns for discussion today.   ROS per HPI    Health Maintenance:  Health Maintenance Due  Topic Date Due   Pneumococcal Vaccine 28-12 Years old (1 of 2 - PCV) Never done   Hepatitis C Screening  Never done   DTaP/Tdap/Td (1 - Tdap) Never done   Cervical Cancer Screening (HPV/Pap Cotest)  Never done   INFLUENZA VACCINE  Never done   COVID-19 Vaccine (1 - 2024-25 season) Never done     Past Medical History: Patient Active Problem List   Diagnosis Date Noted   Chest pain 03/01/2023   Elevated troponin 03/01/2023   NSTEMI (non-ST elevated myocardial infarction) (HCC) 02/27/2023   Unstable angina (HCC) 02/27/2023   Primary hypertension 02/27/2023   Nicotine abuse 02/27/2023   Digital mucinous cyst of finger 09/25/2022   Pain in finger of right hand 09/16/2022   Pain in right knee 07/04/2021      Social History   reports that she has been smoking cigarettes. She has never used smokeless tobacco. She reports current alcohol use. She reports that she does not use drugs.   Family History  family history is not on file.   Medications: reviewed and updated   Objective:   Physical Exam BP 133/80   Pulse 81   Temp (!) 100.7 F (38.2 C) (Oral)   Ht 5\' 2"  (1.575 m)   Wt 116 lb 9.6 oz (52.9 kg)   LMP 03/19/2023 (Approximate)   SpO2 94%   BMI 21.33 kg/m   Physical Exam HENT:     Head: Normocephalic and atraumatic.     Right Ear: Tympanic membrane, ear canal and external ear normal.     Left Ear: Tympanic membrane, ear canal and  external ear normal.     Nose: Nose normal.     Mouth/Throat:     Mouth: Mucous membranes are moist.     Pharynx: Oropharynx is clear.  Eyes:     Extraocular Movements: Extraocular movements intact.     Conjunctiva/sclera: Conjunctivae normal.     Pupils: Pupils are equal, round, and reactive to light.  Cardiovascular:     Rate and Rhythm: Normal rate and regular rhythm.     Pulses: Normal pulses.     Heart sounds: Normal heart sounds.  Pulmonary:     Effort: Pulmonary effort is normal.     Breath sounds: Normal breath sounds.  Musculoskeletal:        General: Normal range of motion.     Cervical back: Normal range of motion and neck supple.  Neurological:     General: No focal deficit present.     Mental Status: She is alert and oriented to person, place, and time.  Psychiatric:        Mood and Affect: Mood normal.        Behavior: Behavior normal.        Assessment & Plan:  1. Encounter to establish care (Primary) - Patient presents today to establish care. During the interim follow-up with primary provider as scheduled.  - Return  for annual physical examination, labs, and health maintenance. Arrive fasting meaning having no food for at least 8 hours prior to appointment. You may have only water or black coffee. Please take scheduled medications as normal.  2. Upper respiratory symptom - Patient today in office with no cardiopulmonary/acute distress.  - Prednisone as prescribed. Counseled on medication adherence/adverse effects.  - Routine screening.   - Follow-up with primary provider as scheduled. - COVID-19, Flu A+B and RSV - POCT rapid strep A; Future - Culture, Group A Strep - predniSONE (DELTASONE) 10 MG tablet; Take 6 tablets (60 mg total) by mouth daily with breakfast for 1 day, THEN 5 tablets (50 mg total) daily with breakfast for 1 day, THEN 4 tablets (40 mg total) daily with breakfast for 1 day, THEN 3 tablets (30 mg total) daily with breakfast for 1 day, THEN 2  tablets (20 mg total) daily with breakfast for 1 day, THEN 1 tablet (10 mg total) daily with breakfast for 1 day.  Dispense: 21 tablet; Refill: 0 - POCT rapid strep A   Patient was given clear instructions to go to Emergency Department or return to medical center if symptoms don't improve, worsen, or new problems develop.The patient verbalized understanding.  I discussed the assessment and treatment plan with the patient. The patient was provided an opportunity to ask questions and all were answered. The patient agreed with the plan and demonstrated an understanding of the instructions.   The patient was advised to call back or seek an in-person evaluation if the symptoms worsen or if the condition fails to improve as anticipated.    Ricky Stabs, NP 03/24/2023, 8:39 AM Primary Care at [facility]

## 2023-03-24 NOTE — Progress Notes (Signed)
 Patient states she is sick.

## 2023-03-25 LAB — COVID-19, FLU A+B AND RSV
Influenza A, NAA: NOT DETECTED
Influenza B, NAA: NOT DETECTED
RSV, NAA: NOT DETECTED
SARS-CoV-2, NAA: NOT DETECTED

## 2023-03-27 LAB — CULTURE, GROUP A STREP: Strep A Culture: NEGATIVE

## 2023-03-29 ENCOUNTER — Other Ambulatory Visit (HOSPITAL_COMMUNITY): Payer: Self-pay

## 2023-04-12 ENCOUNTER — Other Ambulatory Visit (HOSPITAL_COMMUNITY): Payer: Self-pay

## 2023-04-26 ENCOUNTER — Ambulatory Visit (INDEPENDENT_AMBULATORY_CARE_PROVIDER_SITE_OTHER): Payer: PRIVATE HEALTH INSURANCE | Admitting: Family

## 2023-04-26 ENCOUNTER — Encounter: Payer: Self-pay | Admitting: Family

## 2023-04-26 VITALS — BP 137/86 | HR 68 | Temp 97.6°F | Resp 16 | Ht 62.0 in | Wt 114.2 lb

## 2023-04-26 DIAGNOSIS — Z0000 Encounter for general adult medical examination without abnormal findings: Secondary | ICD-10-CM

## 2023-04-26 DIAGNOSIS — Z1329 Encounter for screening for other suspected endocrine disorder: Secondary | ICD-10-CM

## 2023-04-26 DIAGNOSIS — Z13228 Encounter for screening for other metabolic disorders: Secondary | ICD-10-CM

## 2023-04-26 DIAGNOSIS — Z124 Encounter for screening for malignant neoplasm of cervix: Secondary | ICD-10-CM

## 2023-04-26 DIAGNOSIS — Z1231 Encounter for screening mammogram for malignant neoplasm of breast: Secondary | ICD-10-CM

## 2023-04-26 DIAGNOSIS — D509 Iron deficiency anemia, unspecified: Secondary | ICD-10-CM | POA: Diagnosis not present

## 2023-04-26 NOTE — Progress Notes (Signed)
 Patient ID: Carolyn Bryant, female    DOB: 09/11/1980  MRN: [id number]  CC: Annual Exam  Subjective: Carolyn Bryant is a 43 y.o. female who presents for annual exam.   Her concerns today include:  - None. - Established with Cardiology.  Patient Active Problem List   Diagnosis Date Noted   Chest pain 03/01/2023   Elevated troponin 03/01/2023   NSTEMI (non-ST elevated myocardial infarction) (HCC) 02/27/2023   Unstable angina (HCC) 02/27/2023   Primary hypertension 02/27/2023   Nicotine abuse 02/27/2023   Digital mucinous cyst of finger 09/25/2022   Pain in finger of right hand 09/16/2022   Pain in right knee 07/04/2021     Current Outpatient Medications on File Prior to Visit  Medication Sig Dispense Refill   albuterol (VENTOLIN HFA) 108 (90 Base) MCG/ACT inhaler Inhale 2 puffs into the lungs every 6 (six) hours as needed for wheezing or shortness of breath. 18 g 0   aspirin EC 81 MG tablet Take 1 tablet (81 mg total) by mouth daily. Swallow whole. 30 tablet 12   atorvastatin (LIPITOR) 80 MG tablet Take 1 tablet (80 mg total) by mouth daily. 30 tablet 3   isosorbide mononitrate (IMDUR) 30 MG 24 hr tablet Take 0.5 tablets (15 mg total) by mouth daily. 30 tablet 3   No current facility-administered medications on file prior to visit.    Allergies  Allergen Reactions   Peanuts [Peanut Oil] Hives and Swelling    Reaction: throat swelling   Prednisone Rash    Has had it since, Tolerated.    Social History   Socioeconomic History   Marital status: Single    Spouse name: Not on file   Number of children: Not on file   Years of education: Not on file   Highest education level: Not on file  Occupational History   Not on file  Tobacco Use   Smoking status: Every Day    Current packs/day: 0.10    Types: Cigarettes   Smokeless tobacco: Never  Vaping Use   Vaping status: Never Used  Substance and Sexual Activity   Alcohol use: Yes    Comment: occasional   Drug use:  No   Sexual activity: Yes    Birth control/protection: None  Other Topics Concern   Not on file  Social History Narrative   Not on file   Social Drivers of Health   Financial Resource Strain: Low Risk  (04/26/2023)   Overall Financial Resource Strain (CARDIA)    Difficulty of Paying Living Expenses: Not hard at all  Food Insecurity: No Food Insecurity (02/27/2023)   Hunger Vital Sign    Worried About Running Out of Food in the Last Year: Never true    Ran Out of Food in the Last Year: Never true  Transportation Needs: No Transportation Needs (02/27/2023)   PRAPARE - Administrator, Civil Service (Medical): No    Lack of Transportation (Non-Medical): No  Physical Activity: Insufficiently Active (04/26/2023)   Exercise Vital Sign    Days of Exercise per Week: 4 days    Minutes of Exercise per Session: 30 min  Stress: No Stress Concern Present (04/26/2023)   [employer] of Occupational Health - Occupational Stress Questionnaire    Feeling of Stress : Only a little  Social Connections: Socially Isolated (02/27/2023)   Social Connection and Isolation Panel [NHANES]    Frequency of Communication with Friends and Family: More than three times a week  Frequency of Social Gatherings with Friends and Family: Twice a week    Attends Religious Services: Never    Database administrator or Organizations: No    Attends Banker Meetings: Never    Marital Status: Never married  Intimate Partner Violence: Not At Risk (02/27/2023)   Humiliation, Afraid, Rape, and Kick questionnaire    Fear of Current or Ex-Partner: No    Emotionally Abused: No    Physically Abused: No    Sexually Abused: No    History reviewed. No pertinent family history.  Past Surgical History:  Procedure Laterality Date   TUBAL LIGATION      ROS: Review of Systems Negative except as stated above  PHYSICAL EXAM: BP 137/86   Pulse 68   Temp 97.6 F (36.4 C) (Oral)   Resp 16   Ht 5\' 2"   (1.575 m)   Wt 114 lb 3.2 oz (51.8 kg)   LMP 04/25/2023   SpO2 98%   BMI 20.89 kg/m   Physical Exam HENT:     Head: Normocephalic and atraumatic.     Right Ear: Tympanic membrane, ear canal and external ear normal.     Left Ear: Tympanic membrane, ear canal and external ear normal.     Nose: Nose normal.     Mouth/Throat:     Mouth: Mucous membranes are moist.     Pharynx: Oropharynx is clear.  Eyes:     Extraocular Movements: Extraocular movements intact.     Conjunctiva/sclera: Conjunctivae normal.     Pupils: Pupils are equal, round, and reactive to light.  Neck:     Thyroid: No thyroid mass, thyromegaly or thyroid tenderness.  Cardiovascular:     Rate and Rhythm: Normal rate and regular rhythm.     Pulses: Normal pulses.     Heart sounds: Normal heart sounds.  Pulmonary:     Effort: Pulmonary effort is normal.     Breath sounds: Normal breath sounds.  Chest:     Comments: Patient declined. Abdominal:     General: Bowel sounds are normal.     Palpations: Abdomen is soft.  Genitourinary:    Comments: Patient declined. Musculoskeletal:        General: Normal range of motion.     Right shoulder: Normal.     Left shoulder: Normal.     Right upper arm: Normal.     Left upper arm: Normal.     Right elbow: Normal.     Left elbow: Normal.     Right forearm: Normal.     Left forearm: Normal.     Right wrist: Normal.     Left wrist: Normal.     Right hand: Normal.     Left hand: Normal.     Cervical back: Normal, normal range of motion and neck supple.     Thoracic back: Normal.     Lumbar back: Normal.     Right hip: Normal.     Left hip: Normal.     Right upper leg: Normal.     Left upper leg: Normal.     Right knee: Normal.     Left knee: Normal.     Right lower leg: Normal.     Left lower leg: Normal.     Right ankle: Normal.     Left ankle: Normal.     Right foot: Normal.     Left foot: Normal.  Skin:    General: Skin is warm and dry.  Capillary  Refill: Capillary refill takes less than 2 seconds.  Neurological:     General: No focal deficit present.     Mental Status: She is alert and oriented to person, place, and time.  Psychiatric:        Mood and Affect: Mood normal.        Behavior: Behavior normal.    ASSESSMENT AND PLAN: 1. Annual physical exam (Primary) - Counseled on 150 minutes of exercise per week as tolerated, healthy eating (including decreased daily intake of saturated fats, cholesterol, added sugars, sodium), STI prevention, and routine healthcare maintenance.  2. Screening for metabolic disorder - Routine screening.  - CMP14+EGFR  3. Thyroid disorder screen - Routine screening.  - TSH  4. Iron deficiency anemia, unspecified iron deficiency anemia type - Routine screening.  - Referral to Hematology Oncology for evaluation/management. - CBC - Ambulatory referral to Hematology / Oncology  5. Encounter for screening mammogram for malignant neoplasm of breast - Routine screening.  - MM Digital Screening; Future  6. Cervical cancer screening - Referral to Gynecology for evaluation/management. - Ambulatory referral to Gynecology   Patient was given the opportunity to ask questions.  Patient verbalized understanding of the plan and was able to repeat key elements of the plan. Patient was given clear instructions to go to Emergency Department or return to medical center if symptoms don't improve, worsen, or new problems develop.The patient verbalized understanding.   Orders Placed This Encounter  Procedures   MM Digital Screening   CBC   CMP14+EGFR   TSH   Ambulatory referral to Gynecology   Ambulatory referral to Hematology / Oncology    Return in about 1 year (around 04/25/2024) for Physical per patient preference.  Rema Fendt, NP

## 2023-04-27 ENCOUNTER — Other Ambulatory Visit: Payer: Self-pay | Admitting: Family

## 2023-04-27 ENCOUNTER — Encounter: Payer: Self-pay | Admitting: Family

## 2023-04-27 DIAGNOSIS — D509 Iron deficiency anemia, unspecified: Secondary | ICD-10-CM

## 2023-04-27 LAB — CMP14+EGFR
ALT: 7 IU/L (ref 0–32)
AST: 16 IU/L (ref 0–40)
Albumin: 4.4 g/dL (ref 3.9–4.9)
Alkaline Phosphatase: 70 IU/L (ref 44–121)
BUN/Creatinine Ratio: 8 — ABNORMAL LOW (ref 9–23)
BUN: 6 mg/dL (ref 6–24)
Bilirubin Total: <0.2 mg/dL (ref 0.0–1.2)
CO2: 18 mmol/L — ABNORMAL LOW (ref 20–29)
Calcium: 9 mg/dL (ref 8.7–10.2)
Chloride: 106 mmol/L (ref 96–106)
Creatinine, Ser: 0.71 mg/dL (ref 0.57–1.00)
Globulin, Total: 2.6 g/dL (ref 1.5–4.5)
Glucose: 81 mg/dL (ref 70–99)
Potassium: 4.5 mmol/L (ref 3.5–5.2)
Sodium: 139 mmol/L (ref 134–144)
Total Protein: 7 g/dL (ref 6.0–8.5)
eGFR: 109 mL/min/{1.73_m2} (ref >59)

## 2023-04-27 LAB — CBC
Hematocrit: 27.5 % — ABNORMAL LOW (ref 34.0–46.6)
Hemoglobin: 7.8 g/dL — ABNORMAL LOW (ref 11.1–15.9)
MCH: 22.5 pg — ABNORMAL LOW (ref 26.6–33.0)
MCHC: 28.4 g/dL — ABNORMAL LOW (ref 31.5–35.7)
MCV: 79 fL (ref 79–97)
Platelets: 464 10*3/uL — ABNORMAL HIGH (ref 150–450)
RBC: 3.47 x10E6/uL — ABNORMAL LOW (ref 3.77–5.28)
RDW: 19.8 % — ABNORMAL HIGH (ref 11.7–15.4)
WBC: 5.3 10*3/uL (ref 3.4–10.8)

## 2023-04-27 LAB — TSH: TSH: 0.814 u[IU]/mL (ref 0.450–4.500)

## 2023-04-27 MED ORDER — IRON (FERROUS SULFATE) 325 (65 FE) MG PO TABS: 325.0000 mg | ORAL_TABLET | Freq: Every day | ORAL | 0 refills | Status: AC

## 2023-06-11 ENCOUNTER — Telehealth: Payer: PRIVATE HEALTH INSURANCE | Admitting: Physician Assistant

## 2023-06-11 DIAGNOSIS — R200 Anesthesia of skin: Secondary | ICD-10-CM

## 2023-06-11 LAB — LIPID PANEL
Chol/HDL Ratio: 3 ratio (ref 0.0–4.4)
Cholesterol, Total: 204 mg/dL — ABNORMAL HIGH (ref 100–199)
HDL: 67 mg/dL (ref >39)
LDL Chol Calc (NIH): 122 mg/dL — ABNORMAL HIGH (ref 0–99)
Triglycerides: 85 mg/dL (ref 0–149)
VLDL Cholesterol Cal: 15 mg/dL (ref 5–40)

## 2023-06-11 NOTE — Patient Instructions (Signed)
  [name], thank you for joining Marciana Settle, PA-C for today's virtual visit.  While this provider is not your primary care provider (PCP), if your PCP is located in our provider database this encounter information will be shared with them immediately following your visit.   A [facility] MyChart account gives you access to today's visit and all your visits, tests, and labs performed at [facility] " click here if you don't have a [facility] MyChart account or go to [email or web address]  Consent: (Patient) Carolyn Bryant provided verbal consent for this virtual visit at the beginning of the encounter.  Current Medications:  Current Outpatient Medications:    albuterol  (VENTOLIN  HFA) 108 (90 Base) MCG/ACT inhaler, Inhale 2 puffs into the lungs every 6 (six) hours as needed for wheezing or shortness of breath., Disp: 18 g, Rfl: 0   aspirin  EC 81 MG tablet, Take 1 tablet (81 mg total) by mouth daily. Swallow whole., Disp: 30 tablet, Rfl: 12   atorvastatin  (LIPITOR) 80 MG tablet, Take 1 tablet (80 mg total) by mouth daily., Disp: 30 tablet, Rfl: 3   Iron , Ferrous Sulfate , 325 (65 Fe) MG TABS, Take 325 mg by mouth daily., Disp: 90 tablet, Rfl: 0   isosorbide  mononitrate (IMDUR ) 30 MG 24 hr tablet, Take 0.5 tablets (15 mg total) by mouth daily., Disp: 30 tablet, Rfl: 3   Medications ordered in this encounter:  No orders of the defined types were placed in this encounter.    *If you need refills on other medications prior to your next appointment, please contact your pharmacy*  Follow-Up: Call back or seek an in-person evaluation if the symptoms worsen or if the condition fails to improve as anticipated.  [facility] [phone]  Other Instructions Instructed to report to nearest ER for evaluation.    If you have been instructed to have an in-person evaluation today at a local Urgent Care facility, please use the link below. It will take you to a  list of all of our available [facility], including address, phone number and hours of operation. Please do not delay care.  [facility]  If you or a family member do not have a primary care provider, use the link below to schedule a visit and establish care. When you choose a [facility] primary care physician or advanced practice provider, you gain a long-term partner in health. Find a Primary Care Provider  Learn more about [facility] in-office and virtual care options: [facility] - Get Care Now

## 2023-06-11 NOTE — Progress Notes (Signed)
 Virtual Visit Consent   Carolyn Bryant, you are scheduled for a virtual visit with a [facility] provider today. Just as with appointments in the office, your consent must be obtained to participate. Your consent will be active for this visit and any virtual visit you may have with one of our providers in the next 365 days. If you have a MyChart account, a copy of this consent can be sent to you electronically.  As this is a virtual visit, video technology does not allow for your provider to perform a traditional examination. This may limit your provider's ability to fully assess your condition. If your provider identifies any concerns that need to be evaluated in person or the need to arrange testing (such as labs, EKG, etc.), we will make arrangements to do so. Although advances in technology are sophisticated, we cannot ensure that it will always work on either your end or our end. If the connection with a video visit is poor, the visit may have to be switched to a telephone visit. With either a video or telephone visit, we are not always able to ensure that we have a secure connection.  By engaging in this virtual visit, you consent to the provision of healthcare and authorize for your insurance to be billed (if applicable) for the services provided during this visit. Depending on your insurance coverage, you may receive a charge related to this service.  I need to obtain your verbal consent now. Are you willing to proceed with your visit today? Carolyn Bryant has provided verbal consent on 06/11/2023 for a virtual visit (video or telephone). Marciana Settle, [place]  Date: 06/11/2023 3:34 PM   Virtual Visit via Video Note   I, Marciana Settle, connected with  Carolyn Bryant  ([id number], 1980/03/29) on 06/11/23 at  3:45 PM EDT by a video-enabled telemedicine application and verified that I am speaking with the correct person using two identifiers.  Location: Patient: Virtual Visit Location Patient:  Home Provider: Virtual Visit Location Provider: Home Office   I discussed the limitations of evaluation and management by telemedicine and the availability of in person appointments. The patient expressed understanding and agreed to proceed.    History of Present Illness: Carolyn Bryant is a 43 y.o. who identifies as a female who was assigned female at birth, and is being seen today for numbness in fingers after sleeping.States it happens randomly after waking up, her entire right hand. States 2-3 digits continue to be numb. Denies any headache, vision changes, nausea, vomiting, diarrhea.   HPI: HPI  Problems:  Patient Active Problem List   Diagnosis Date Noted   Chest pain 03/01/2023   Elevated troponin 03/01/2023   NSTEMI (non-ST elevated myocardial infarction) (HCC) 02/27/2023   Unstable angina (HCC) 02/27/2023   Primary hypertension 02/27/2023   Nicotine abuse 02/27/2023   Digital mucinous cyst of finger 09/25/2022   Pain in finger of right hand 09/16/2022   Pain in right knee 07/04/2021    Allergies:  Allergies  Allergen Reactions   Peanuts [Peanut Oil] Hives and Swelling    Reaction: throat swelling   Prednisone  Rash    Has had it since, Tolerated.   Medications:  Current Outpatient Medications:    albuterol  (VENTOLIN  HFA) 108 (90 Base) MCG/ACT inhaler, Inhale 2 puffs into the lungs every 6 (six) hours as needed for wheezing or shortness of breath., Disp: 18 g, Rfl: 0   aspirin  EC 81 MG tablet, Take 1 tablet (81 mg  total) by mouth daily. Swallow whole., Disp: 30 tablet, Rfl: 12   atorvastatin  (LIPITOR) 80 MG tablet, Take 1 tablet (80 mg total) by mouth daily., Disp: 30 tablet, Rfl: 3   Iron , Ferrous Sulfate , 325 (65 Fe) MG TABS, Take 325 mg by mouth daily., Disp: 90 tablet, Rfl: 0   isosorbide  mononitrate (IMDUR ) 30 MG 24 hr tablet, Take 0.5 tablets (15 mg total) by mouth daily., Disp: 30 tablet, Rfl: 3  Observations/Objective: Patient is well-developed, well-nourished  in no acute distress.  Resting comfortably  at home.  Head is normocephalic, atraumatic.  No labored breathing.  Speech is clear and coherent with logical content.  Patient is alert and oriented at baseline.    Assessment and Plan: 1. Numbness and tingling (Primary)  Patient with past medical history of NSTEMI unstable angina and hypertension presenting with numbness and tingling of her right hand.  Overall exam on telehealth appointment today looked fine however out of concern for underlying K[place] advised the patient she reports she will report to the nearest emergency room for evaluation.  She will need diagnostics labs as well to rule out any acute or emergent cause of numbness or tingling such as stroke.  She was in agreement with this plan no facial asymmetry no slurring speech no altered mental status noted on exam today.  All questions answered patient was in agreement with this plan.  Follow Up Instructions: I discussed the assessment and treatment plan with the patient. The patient was provided an opportunity to ask questions and all were answered. The patient agreed with the plan and demonstrated an understanding of the instructions.  A copy of instructions were sent to the patient via MyChart unless otherwise noted below.    The patient was advised to call back or seek an in-person evaluation if the symptoms worsen or if the condition fails to improve as anticipated.    Marciana Settle, PA-C

## 2023-06-22 ENCOUNTER — Encounter (INDEPENDENT_AMBULATORY_CARE_PROVIDER_SITE_OTHER): Payer: Self-pay

## 2023-07-04 ENCOUNTER — Ambulatory Visit (HOSPITAL_BASED_OUTPATIENT_CLINIC_OR_DEPARTMENT_OTHER): Payer: Self-pay | Admitting: Cardiology

## 2023-07-05 ENCOUNTER — Telehealth: Payer: Self-pay | Admitting: Cardiology

## 2023-07-05 NOTE — Telephone Encounter (Signed)
 Pt called back in about her lab results. She states she hasn't been taking her atorvastatin  due to financial issues. She asked if there is any assistance we can provide or a different medication she can try that will be cheaper. Please advise.

## 2023-07-06 ENCOUNTER — Other Ambulatory Visit (HOSPITAL_COMMUNITY): Payer: Self-pay

## 2023-07-06 NOTE — Telephone Encounter (Signed)
 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

## 2023-07-09 ENCOUNTER — Other Ambulatory Visit (HOSPITAL_COMMUNITY): Payer: Self-pay

## 2023-07-09 ENCOUNTER — Other Ambulatory Visit (HOSPITAL_BASED_OUTPATIENT_CLINIC_OR_DEPARTMENT_OTHER): Payer: Self-pay

## 2023-07-09 MED ORDER — ISOSORBIDE MONONITRATE ER 30 MG PO TB24
15.0000 mg | ORAL_TABLET | Freq: Every day | ORAL | 11 refills | Status: AC
Start: 2023-07-09 — End: ?
  Filled 2023-07-09: qty 15, 30d supply, fill #0

## 2023-07-09 MED ORDER — ATORVASTATIN CALCIUM 80 MG PO TABS
80.0000 mg | ORAL_TABLET | Freq: Every day | ORAL | 11 refills | Status: AC
  Filled 2023-07-09: qty 30, 30d supply, fill #0

## 2023-07-09 MED ORDER — ASPIRIN 81 MG PO TBEC
81.0000 mg | DELAYED_RELEASE_TABLET | Freq: Every day | ORAL | 3 refills | Status: AC
  Filled 2023-07-09: qty 90, 90d supply, fill #0

## 2023-07-09 NOTE — Addendum Note (Signed)
 Addended by: Guss Legacy on: 07/09/2023 09:43 AM   Modules accepted: Orders

## 2023-07-09 NOTE — Telephone Encounter (Signed)
 Closing this encounter - already on another [facility] encounter 6/20.

## 2023-07-19 ENCOUNTER — Ambulatory Visit
Admission: RE | Admit: 2023-07-19 | Discharge: 2023-07-19 | Disposition: A | Discharge status: Home / Self Care | Payer: PRIVATE HEALTH INSURANCE | Source: Ambulatory Visit | Attending: Physician Assistant | Admitting: Physician Assistant

## 2023-07-19 VITALS — BP 119/75 | HR 69 | Temp 98.1°F | Resp 16

## 2023-07-19 DIAGNOSIS — M25561 Pain in right knee: Secondary | ICD-10-CM

## 2023-07-19 MED ORDER — PREDNISONE 20 MG PO TABS
40.0000 mg | ORAL_TABLET | Freq: Every day | ORAL | 0 refills | Status: AC
Start: 2023-07-19 — End: 2023-07-24

## 2023-07-19 NOTE — ED Triage Notes (Signed)
 Pt reports R knee pain x1 week. No specific injury but has recurrent hx of pain in both knees. Has a job that keeps her on her feet for long hours. Having issues with full range of motion in joint and has limping gait walking back to triage. Pt notes slight swelling to joint over the last week. No med use for symptoms.

## 2023-07-19 NOTE — ED Provider Notes (Signed)
 [name]    CSN: [id number] Arrival date & time: 07/19/23  9171      History   Chief Complaint Chief Complaint  Patient presents with   Knee Pain    Knee hurts feels like alot of pressure.  Can barely put weight on it.   Partially bends - Entered by patient    HPI Carolyn Bryant is a 43 y.o. female.   Patient presents today for evaluation of right knee pain just over the last week.  She reports that she has had similar symptoms in the past.  She does report that she does stand on her feet for long hours at her job.  She denies any known injury.  She states she has a limp with walking now due to pain with ambulation.  She has not taken any medication for symptoms.  The history is provided by the patient.  Knee Pain Associated symptoms: no fever     Past Medical History:  Diagnosis Date   Asthma    Chest pain 03/01/2023   Digital mucinous cyst of finger 09/25/2022   Elevated troponin 03/01/2023   Nicotine abuse 02/27/2023   Pain in finger of right hand 09/16/2022   Pain in right knee 07/04/2021    Patient Active Problem List   Diagnosis Date Noted   NSTEMI (non-ST elevated myocardial infarction) (HCC) 02/27/2023   Unstable angina (HCC) 02/27/2023   Primary hypertension 02/27/2023   Ganglion cyst of finger 09/25/2022    Past Surgical History:  Procedure Laterality Date   TUBAL LIGATION      OB History     Gravida  3   Para  3   Term  3   Preterm      AB  0   Living  2      SAB  0   IAB      Ectopic      Multiple      Live Births               Home Medications    Prior to Admission medications   Medication Sig Start Date End Date Taking? Authorizing Provider  aspirin  EC 81 MG tablet Take 1 tablet (81 mg total) by mouth daily. Swallow whole. 07/09/23  Yes Lonni Slain, MD  atorvastatin  (LIPITOR) 80 MG tablet Take 1 tablet (80 mg total) by mouth daily. 07/09/23  Yes Lonni Slain, MD  Iron , Ferrous  Sulfate, 325 (65 Fe) MG TABS Take 325 mg by mouth daily. 04/27/23  Yes Lorren, Amy J, NP  isosorbide  mononitrate (IMDUR ) 30 MG 24 hr tablet Take 0.5 tablets (15 mg total) by mouth daily. 07/09/23  Yes Lonni Slain, MD  predniSONE  (DELTASONE ) 20 MG tablet Take 2 tablets (40 mg total) by mouth daily with breakfast for 5 days. 07/19/23 07/24/23 Yes Billy Asberry JULIANNA, PA-C  albuterol  (VENTOLIN  HFA) 108 (90 Base) MCG/ACT inhaler Inhale 2 puffs into the lungs every 6 (six) hours as needed for wheezing or shortness of breath. 12/13/22   Griselda Norris, MD    Family History History reviewed. No pertinent family history.  Social History Social History   Tobacco Use   Smoking status: Every Day    Current packs/day: 0.10    Types: Cigarettes    Passive exposure: Current   Smokeless tobacco: Never  Vaping Use   Vaping status: Never Used  Substance Use Topics   Alcohol use: Not Currently    Comment: occasional   Drug use: No  Allergies   Peanuts [peanut oil]   Review of Systems Review of Systems  Constitutional:  Negative for chills and fever.  Eyes:  Negative for discharge and redness.  Respiratory:  Negative for shortness of breath.   Gastrointestinal:  Negative for abdominal pain, nausea and vomiting.  Musculoskeletal:  Positive for arthralgias. Negative for joint swelling.  Neurological:  Negative for numbness.     Physical Exam Triage Vital Signs ED Triage Vitals [07/19/23 0841]  Encounter Vitals Group     BP 119/75     Girls Systolic BP Percentile      Girls Diastolic BP Percentile      Boys Systolic BP Percentile      Boys Diastolic BP Percentile      Pulse Rate 69     Resp 16     Temp 98.1 F (36.7 C)     Temp Source Oral     SpO2 98 %     Weight      Height      Head Circumference      Peak Flow      Pain Score 4     Pain Loc      Pain Education      Exclude from Growth Chart    No data found.  Updated Vital Signs BP 119/75 (BP Location: Left  Arm)   Pulse 69   Temp 98.1 F (36.7 C) (Oral)   Resp 16   LMP 06/27/2023 (Exact Date)   SpO2 98%   Visual Acuity Right Eye Distance:   Left Eye Distance:   Bilateral Distance:    Right Eye Near:   Left Eye Near:    Bilateral Near:     Physical Exam Vitals and nursing note reviewed.  Constitutional:      General: She is not in acute distress.    Appearance: Normal appearance. She is not ill-appearing.  HENT:     Head: Normocephalic and atraumatic.   Eyes:     Conjunctiva/sclera: Conjunctivae normal.    Cardiovascular:     Rate and Rhythm: Normal rate.  Pulmonary:     Effort: Pulmonary effort is normal. No respiratory distress.   Musculoskeletal:     Comments: Full range of motion of right knee.  Mild tenderness diffusely to both anterior and posterior right knee.  No tenderness to right calf.  No swelling appreciated.   Neurological:     Mental Status: She is alert.   Psychiatric:        Mood and Affect: Mood normal.        Behavior: Behavior normal.        Thought Content: Thought content normal.      UC Treatments / Results  Labs (all labs ordered are listed, but only abnormal results are displayed) Labs Reviewed - No data to display  EKG   Radiology No results found.  Procedures Procedures (including critical care time)  Medications Ordered in UC Medications - No data to display  Initial Impression / Assessment and Plan / UC Course  I have reviewed the triage vital signs and the nursing notes.  Pertinent labs & imaging results that were available during my care of the patient were reviewed by me and considered in my medical decision making (see chart for details).    Unclear etiology of chronic knee pain but suspect inflammatory process and will treat with steroid burst with strict instruction to follow-up with Ortho given continued issues.  Patient is agreeable with same.  Final Clinical Impressions(s) / UC Diagnoses   Final diagnoses:   Acute pain of right knee   Discharge Instructions   None    ED Prescriptions     Medication Sig Dispense Auth. Provider   predniSONE  (DELTASONE ) 20 MG tablet Take 2 tablets (40 mg total) by mouth daily with breakfast for 5 days. 10 tablet Billy Asberry FALCON, PA-C      PDMP not reviewed this encounter.   Billy Asberry FALCON, PA-C 07/19/23 1914

## 2023-07-20 ENCOUNTER — Ambulatory Visit (HOSPITAL_BASED_OUTPATIENT_CLINIC_OR_DEPARTMENT_OTHER): Payer: PRIVATE HEALTH INSURANCE | Admitting: Student

## 2023-07-21 ENCOUNTER — Encounter (HOSPITAL_BASED_OUTPATIENT_CLINIC_OR_DEPARTMENT_OTHER): Payer: Self-pay | Admitting: Student

## 2023-07-21 ENCOUNTER — Ambulatory Visit (INDEPENDENT_AMBULATORY_CARE_PROVIDER_SITE_OTHER): Payer: PRIVATE HEALTH INSURANCE | Admitting: Student

## 2023-07-21 ENCOUNTER — Ambulatory Visit (HOSPITAL_BASED_OUTPATIENT_CLINIC_OR_DEPARTMENT_OTHER): Payer: PRIVATE HEALTH INSURANCE

## 2023-07-21 DIAGNOSIS — M25561 Pain in right knee: Secondary | ICD-10-CM

## 2023-07-21 NOTE — Progress Notes (Signed)
 Chief Complaint: Right knee pain    Discussed the use of AI scribe software for clinical note transcription with the patient, who gave verbal consent to proceed.  History of Present Illness Carolyn Bryant is a 43 year old female who presents with right knee pain and swelling.  She has experienced right knee pain and swelling for one week, with no specific injury. The pain is pressure-like, located mainly in the posterior knee crease, and radiates to the calf. Stiffness occurs after inactivity, with a popping sound upon waking or standing. Movement alleviates stiffness, but it returns with prolonged inactivity. Pain increases with full knee flexion.  Her work as an International aid/development worker at [employer], cleaning, and ladder climbing, which may exacerbate her symptoms. She works third shift and has been working almost continuously for a month. She is taking prednisone  twice daily for five days since Monday, with minimal relief.   Surgical History:   Lung  PMH/PSH/Family History/Social History/Meds/Allergies:    Past Medical History:  Diagnosis Date   Asthma    Chest pain 03/01/2023   Digital mucinous cyst of finger 09/25/2022   Elevated troponin 03/01/2023   Nicotine abuse 02/27/2023   Pain in finger of right hand 09/16/2022   Pain in right knee 07/04/2021   Past Surgical History:  Procedure Laterality Date   TUBAL LIGATION     Social History   Socioeconomic History   Marital status: Single    Spouse name: Not on file   Number of children: Not on file   Years of education: Not on file   Highest education level: Not on file  Occupational History   Not on file  Tobacco Use   Smoking status: Every Day    Current packs/day: 0.10    Types: Cigarettes    Passive exposure: Current   Smokeless tobacco: Never  Vaping Use   Vaping status: Never Used  Substance and Sexual Activity   Alcohol use: Not Currently     Comment: occasional   Drug use: No   Sexual activity: Yes    Birth control/protection: None  Other Topics Concern   Not on file  Social History Narrative   Not on file   Social Drivers of Health   Financial Resource Strain: Low Risk  (04/26/2023)   Overall Financial Resource Strain (CARDIA)    Difficulty of Paying Living Expenses: Not hard at all  Food Insecurity: No Food Insecurity (02/27/2023)   Hunger Vital Sign    Worried About Running Out of Food in the Last Year: Never true    Ran Out of Food in the Last Year: Never true  Transportation Needs: No Transportation Needs (02/27/2023)   PRAPARE - Administrator, Civil Service (Medical): No    Lack of Transportation (Non-Medical): No  Physical Activity: Insufficiently Active (04/26/2023)   Exercise Vital Sign    Days of Exercise per Week: 4 days    Minutes of Exercise per Session: 30 min  Stress: No Stress Concern Present (04/26/2023)   [employer] of Occupational Health - Occupational Stress Questionnaire    Feeling of Stress : Only a little  Social Connections: Socially Isolated (02/27/2023)   Social Connection and Isolation Panel    Frequency of Communication with Friends and Family: More than three times  a week    Frequency of Social Gatherings with Friends and Family: Twice a week    Attends Religious Services: Never    Database administrator or Organizations: No    Attends Banker Meetings: Never    Marital Status: Never married   History reviewed. No pertinent family history. Allergies  Allergen Reactions   Peanuts [Peanut Oil] Hives and Swelling    Reaction: throat swelling   Current Outpatient Medications  Medication Sig Dispense Refill   albuterol  (VENTOLIN  HFA) 108 (90 Base) MCG/ACT inhaler Inhale 2 puffs into the lungs every 6 (six) hours as needed for wheezing or shortness of breath. 18 g 0   aspirin  EC 81 MG tablet Take 1 tablet (81 mg total) by mouth daily. Swallow whole. 90 tablet 3    atorvastatin  (LIPITOR) 80 MG tablet Take 1 tablet (80 mg total) by mouth daily. 30 tablet 11   Iron , Ferrous Sulfate , 325 (65 Fe) MG TABS Take 325 mg by mouth daily. 90 tablet 0   isosorbide  mononitrate (IMDUR ) 30 MG 24 hr tablet Take 0.5 tablets (15 mg total) by mouth daily. 15 tablet 11   predniSONE  (DELTASONE ) 20 MG tablet Take 2 tablets (40 mg total) by mouth daily with breakfast for 5 days. 10 tablet 0   No current facility-administered medications for this visit.   No results found.  Review of Systems:   A ROS was performed including pertinent positives and negatives as documented in the HPI.  Physical Exam :   Constitutional: NAD and appears stated age Neurological: Alert and oriented Psych: Appropriate affect and cooperative Last menstrual period 06/27/2023.   Comprehensive Musculoskeletal Exam:    Right knee exam demonstrates active range of motion from 5 to 120 degrees.  No significant medial or lateral joint line tenderness.  Stable collaterals with varus and valgus stress.  Negative Lachman.  Knee extension strength 4/5 and flexion strength is 5/5.  No effusion present.  Imaging:   Xray (right knee 4 views): Well-maintained joint spacing without evidence of acute abnormality   I personally reviewed and interpreted the radiographs.      Assessment & Plan Right knee pain   Patient is experiencing 1 week history of atraumatic right knee pain, located throughout the knee particularly posteriorly.  X-rays today are well-appearing.  Has been taking oral prednisone  without relief.  Symptoms do appear to get worse with prolonged periods of inactivity and knee flexion, which may suggest patellofemoral syndrome.  Recommend a supportive knee brace to use during work.  HEP given for knee strengthening exercises.  If symptoms continue to persist, may consider diagnostic intra-articular injection.     I personally saw and evaluated the patient, and participated in the management  and treatment plan.  Leonce Reveal, PA-C Orthopedics

## 2023-08-19 ENCOUNTER — Encounter: Payer: Self-pay | Admitting: Obstetrics & Gynecology

## 2023-08-19 ENCOUNTER — Other Ambulatory Visit (HOSPITAL_COMMUNITY)
Admission: RE | Admit: 2023-08-19 | Discharge: 2023-08-19 | Disposition: A | Discharge status: Home / Self Care | Payer: PRIVATE HEALTH INSURANCE | Source: Ambulatory Visit | Attending: Obstetrics & Gynecology | Admitting: Obstetrics & Gynecology

## 2023-08-19 ENCOUNTER — Ambulatory Visit (INDEPENDENT_AMBULATORY_CARE_PROVIDER_SITE_OTHER): Payer: PRIVATE HEALTH INSURANCE | Admitting: Obstetrics & Gynecology

## 2023-08-19 VITALS — BP 118/74 | HR 69 | Wt 110.0 lb

## 2023-08-19 DIAGNOSIS — Z1231 Encounter for screening mammogram for malignant neoplasm of breast: Secondary | ICD-10-CM

## 2023-08-19 DIAGNOSIS — Z01419 Encounter for gynecological examination (general) (routine) without abnormal findings: Secondary | ICD-10-CM | POA: Insufficient documentation

## 2023-08-19 NOTE — Progress Notes (Signed)
 GYNECOLOGY ANNUAL PREVENTATIVE CARE ENCOUNTER NOTE  History:    Carolyn Bryant is a 43 y.o. [phone] female here for a routine annual gynecologic exam.  Current complaints: none.   Denies abnormal vaginal bleeding, discharge, pelvic pain, problems with intercourse or other gynecologic concerns.  Gynecologic History Patient's last menstrual period was 08/19/2023. Contraception: tubal ligation Last Pap: many years ago. Result was normal  Last Mammogram: unsure.  Result was normal  Obstetric History OB History  Gravida Para Term Preterm AB Living  3 3 3   0 2  SAB IAB Ectopic Multiple Live Births  0    3    # Outcome Date GA Lbr Len/2nd Weight Sex Type Anes PTL Lv  3 Term      Vag-Spont   LIV  2 Term      Vag-Spont   LIV  1 Term      Vag-Spont   DEC    Past Medical History:  Diagnosis Date   Asthma    Chest pain 03/01/2023   Digital mucinous cyst of finger 09/25/2022   Elevated troponin 03/01/2023   Nicotine abuse 02/27/2023   Pain in finger of right hand 09/16/2022   Pain in right knee 07/04/2021    Past Surgical History:  Procedure Laterality Date   TUBAL LIGATION  2011    Current Outpatient Medications on File Prior to Visit  Medication Sig Dispense Refill   albuterol  (VENTOLIN  HFA) 108 (90 Base) MCG/ACT inhaler Inhale 2 puffs into the lungs every 6 (six) hours as needed for wheezing or shortness of breath. 18 g 0   aspirin  EC 81 MG tablet Take 1 tablet (81 mg total) by mouth daily. Swallow whole. 90 tablet 3   atorvastatin  (LIPITOR ) 80 MG tablet Take 1 tablet (80 mg total) by mouth daily. 30 tablet 11   Iron , Ferrous Sulfate , 325 (65 Fe) MG TABS Take 325 mg by mouth daily. 90 tablet 0   isosorbide  mononitrate (IMDUR ) 30 MG 24 hr tablet Take 0.5 tablets (15 mg total) by mouth daily. 15 tablet 11   No current facility-administered medications on file prior to visit.    Allergies  Allergen Reactions   Peanuts [Peanut Oil] Hives and Swelling    Reaction: throat  swelling    Social History:  reports that she has been smoking cigarettes. She has been exposed to tobacco smoke. She has never used smokeless tobacco. She reports that she does not currently use alcohol. She reports that she does not use drugs.  History reviewed. No pertinent family history.  The following portions of the patient's history were reviewed and updated as appropriate: allergies, current medications, past family history, past medical history, past social history, past surgical history and problem list.  Review of Systems Pertinent items noted in HPI and remainder of comprehensive ROS otherwise negative.  Physical Exam:  BP 118/74   Pulse 69   Wt 110 lb (49.9 kg)   LMP 08/19/2023   BMI 20.12 kg/m  CONSTITUTIONAL: Well-developed, well-nourished female in no acute distress.  HENT:  Normocephalic, atraumatic, External right and left ear normal. Poor dentition noted. EYES: Conjunctivae and EOM are normal. Pupils are equal, round, and reactive to light. No scleral icterus.  NECK: Normal range of motion, supple, no masses observed. SKIN: Skin is warm and dry. No rash noted. Not diaphoretic. No erythema. No pallor. MUSCULOSKELETAL: Normal range of motion. No tenderness.  No cyanosis, clubbing, or edema. NEUROLOGIC: Alert and oriented to person, place, and time.  Normal muscle tone coordination.  PSYCHIATRIC: Normal mood and affect. Normal behavior. Normal judgment and thought content. CARDIOVASCULAR: Normal heart rate noted, regular rhythm RESPIRATORY: Clear to auscultation bilaterally. Effort and breath sounds normal, no problems with respiration noted. BREASTS: Symmetric in size. No masses, tenderness, skin changes, nipple drainage, or lymphadenopathy bilaterally. Performed in the presence of a chaperone. ABDOMEN: Soft, no distention noted.  No tenderness, rebound or guarding.  PELVIC: Normal appearing external genitalia and urethral meatus; normal appearing vaginal mucosa and  cervix. Currently on period. Pap smear obtained.  Normal uterine size, no other palpable masses, no uterine or adnexal tenderness.  Performed in the presence of a chaperone.  Assessment and Plan:     1. Breast cancer screening by mammogram Mammogram scheduled for breast cancer screening. - MM 3D SCREENING MAMMOGRAM BILATERAL BREAST; Future  2. Well woman exam with routine gynecological exam (Primary) - Cytology - PAP( [facility]) Will follow up results of pap smear and manage accordingly. Routine preventative health maintenance measures emphasized. Please refer to After Visit Summary for other counseling recommendations.      GLORIS HUGGER, MD, FACOG Obstetrician & Gynecologist, [facility] for [employer], [facility]

## 2023-08-27 LAB — CYTOLOGY - PAP
Comment: NEGATIVE
Diagnosis: NEGATIVE
Diagnosis: REACTIVE
High risk HPV: NEGATIVE

## 2023-08-30 ENCOUNTER — Ambulatory Visit: Payer: Self-pay | Admitting: Obstetrics & Gynecology

## 2023-09-10 ENCOUNTER — Other Ambulatory Visit: Payer: Self-pay

## 2023-09-10 ENCOUNTER — Encounter (HOSPITAL_COMMUNITY): Payer: Self-pay

## 2023-09-10 ENCOUNTER — Ambulatory Visit: Payer: PRIVATE HEALTH INSURANCE

## 2023-09-10 ENCOUNTER — Emergency Department (HOSPITAL_COMMUNITY)
Admission: EM | Admit: 2023-09-10 | Discharge: 2023-09-11 | Disposition: A | Discharge status: Home / Self Care | Payer: PRIVATE HEALTH INSURANCE | Attending: Emergency Medicine | Admitting: Emergency Medicine

## 2023-09-10 DIAGNOSIS — R109 Unspecified abdominal pain: Secondary | ICD-10-CM | POA: Diagnosis not present

## 2023-09-10 DIAGNOSIS — R112 Nausea with vomiting, unspecified: Secondary | ICD-10-CM | POA: Diagnosis present

## 2023-09-10 DIAGNOSIS — Z7982 Long term (current) use of aspirin: Secondary | ICD-10-CM | POA: Diagnosis not present

## 2023-09-10 DIAGNOSIS — Z91010 Allergy to peanuts: Secondary | ICD-10-CM | POA: Insufficient documentation

## 2023-09-10 DIAGNOSIS — R197 Diarrhea, unspecified: Secondary | ICD-10-CM | POA: Diagnosis not present

## 2023-09-10 DIAGNOSIS — K529 Noninfective gastroenteritis and colitis, unspecified: Secondary | ICD-10-CM

## 2023-09-10 NOTE — ED Triage Notes (Signed)
 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 Pt reports ABD pain, chills, and generalized weakness. Pt denies sick contacts.

## 2023-09-11 LAB — COMPREHENSIVE METABOLIC PANEL WITH GFR
ALT: 13 U/L (ref 0–44)
AST: 17 U/L (ref 15–41)
Albumin: 4 g/dL (ref 3.5–5.0)
Alkaline Phosphatase: 60 U/L (ref 38–126)
Anion gap: 10 (ref 5–15)
BUN: 5 mg/dL — ABNORMAL LOW (ref 6–20)
CO2: 21 mmol/L — ABNORMAL LOW (ref 22–32)
Calcium: 9 mg/dL (ref 8.9–10.3)
Chloride: 108 mmol/L (ref 98–111)
Creatinine, Ser: 0.52 mg/dL (ref 0.44–1.00)
GFR, Estimated: >60 mL/min (ref >60)
Glucose, Bld: 145 mg/dL — ABNORMAL HIGH (ref 70–99)
Potassium: 3.6 mmol/L (ref 3.5–5.1)
Sodium: 139 mmol/L (ref 135–145)
Total Bilirubin: 0.3 mg/dL (ref 0.0–1.2)
Total Protein: 8.1 g/dL (ref 6.5–8.1)

## 2023-09-11 LAB — CBC
HCT: 30.3 % — ABNORMAL LOW (ref 36.0–46.0)
Hemoglobin: 9 g/dL — ABNORMAL LOW (ref 12.0–15.0)
MCH: 22.3 pg — ABNORMAL LOW (ref 26.0–34.0)
MCHC: 29.7 g/dL — ABNORMAL LOW (ref 30.0–36.0)
MCV: 75.2 fL — ABNORMAL LOW (ref 80.0–100.0)
Platelets: 414 K/uL — ABNORMAL HIGH (ref 150–400)
RBC: 4.03 MIL/uL (ref 3.87–5.11)
RDW: 22.8 % — ABNORMAL HIGH (ref 11.5–15.5)
WBC: 6.8 K/uL (ref 4.0–10.5)
nRBC: 0 % (ref 0.0–0.2)

## 2023-09-11 LAB — LIPASE, BLOOD: Lipase: 27 U/L (ref 11–51)

## 2023-09-11 LAB — HCG, SERUM, QUALITATIVE: Preg, Serum: NEGATIVE

## 2023-09-11 NOTE — ED Provider Notes (Signed)
 Carolyn Bryant Provider Note   CSN: [id number] Arrival date & time: 09/10/23  2314     Patient presents with: Emesis   Carolyn Bryant is a 43 y.o. female who presents with chills, nausea, vomiting, diarrhea, and abdominal pain that started about 4 hours ago, suddenly while she was at work.  No known ill contacts, no new foods or eating from new places today.  Was feeling well prior to this evening.  To my arrival to the bedside she states that she is feeling much improved, NBNB emesis and nonbloody nonmelanotic stool.  Accompanied by her boyfriend at bedside.  Patient with history of NSTEMI and hypertension, endorses compliance with baby aspirin , Imdur , and atorvastatin .   HPI     Prior to Admission medications   Medication Sig Start Date End Date Taking? Authorizing Provider  albuterol  (VENTOLIN  HFA) 108 (90 Base) MCG/ACT inhaler Inhale 2 puffs into the lungs every 6 (six) hours as needed for wheezing or shortness of breath. 12/13/22   Griselda Norris, MD  aspirin  EC 81 MG tablet Take 1 tablet (81 mg total) by mouth daily. Swallow whole. 07/09/23   Lonni Slain, MD  atorvastatin  (LIPITOR ) 80 MG tablet Take 1 tablet (80 mg total) by mouth daily. 07/09/23   Lonni Slain, MD  Iron , Ferrous Sulfate , 325 (65 Fe) MG TABS Take 325 mg by mouth daily. 04/27/23   Lorren Greig PARAS, NP  isosorbide  mononitrate (IMDUR ) 30 MG 24 hr tablet Take 0.5 tablets (15 mg total) by mouth daily. 07/09/23   Lonni Slain, MD    Allergies: Peanuts [peanut oil]    Review of Systems  Constitutional:  Positive for appetite change and chills.  Gastrointestinal:  Positive for abdominal pain, diarrhea, nausea and vomiting.    Updated Vital Signs BP (!) 144/83   Pulse (!) 55   Temp 98.3 F (36.8 C)   Resp 20   Ht 5' 2 (1.575 m)   Wt 49.9 kg   LMP 08/27/2023   SpO2 98%   BMI 20.12 kg/m   Physical Exam Vitals and nursing note reviewed.   Constitutional:      Appearance: She is not ill-appearing or toxic-appearing.  HENT:     Head: Normocephalic and atraumatic.     Mouth/Throat:     Mouth: Mucous membranes are moist.     Dentition: Abnormal dentition. Dental caries present.     Pharynx: No oropharyngeal exudate or posterior oropharyngeal erythema.  Eyes:     General:        Right eye: No discharge.        Left eye: No discharge.     Conjunctiva/sclera: Conjunctivae normal.  Cardiovascular:     Rate and Rhythm: Normal rate and regular rhythm.     Pulses: Normal pulses.     Heart sounds: Normal heart sounds. No murmur heard. Pulmonary:     Effort: Pulmonary effort is normal. No respiratory distress.     Breath sounds: Normal breath sounds. No wheezing or rales.  Abdominal:     General: Bowel sounds are normal. There is no distension.     Palpations: Abdomen is soft.     Tenderness: There is no abdominal tenderness. There is no right CVA tenderness, left CVA tenderness, guarding or rebound.  Musculoskeletal:        General: No deformity.     Cervical back: Neck supple.     Right lower leg: No edema.     Left lower  leg: No edema.  Skin:    General: Skin is warm and dry.     Capillary Refill: Capillary refill takes less than 2 seconds.  Neurological:     General: No focal deficit present.     Mental Status: She is alert and oriented to person, place, and time. Mental status is at baseline.  Psychiatric:        Mood and Affect: Mood normal.     (all labs ordered are listed, but only abnormal results are displayed) Labs Reviewed  COMPREHENSIVE METABOLIC PANEL WITH GFR - Abnormal; Notable for the following components:      Result Value   CO2 21 (*)    Glucose, Bld 145 (*)    BUN 5 (*)    All other components within normal limits  CBC - Abnormal; Notable for the following components:   Hemoglobin 9.0 (*)    HCT 30.3 (*)    MCV 75.2 (*)    MCH 22.3 (*)    MCHC 29.7 (*)    RDW 22.8 (*)    Platelets 414  (*)    All other components within normal limits  RESP PANEL BY RT-PCR (RSV, FLU A&B, COVID)  RVPGX2  LIPASE, BLOOD  HCG, SERUM, QUALITATIVE  URINALYSIS, ROUTINE W REFLEX MICROSCOPIC    EKG: None  Radiology: No results found.   Procedures   Medications Ordered in the ED - No data to display                                  Medical Decision Making 43 y/o female who presents with nausea vomiting diarrhea and abdominal pain.  Mildly apprehensive today, vital signs otherwise normal.  Cardiopulmonary abdominal exams are benign.  Patient is well-appearing at this time, resting comfortably in her hospital bed with normal hemodynamics.    DDx includes but limited to foodborne illness, viral gastroenteritis, bowel obstruction, diverticulitis, appendicitis.  CBC  Amount and/or Complexity of Data Reviewed Labs: ordered.    Details: CBC with anemia improved at patient's baseline, CMP unremarkable, lipase is normal and pregnancy test negative.  Patient denies urinary symptoms and prefers not to provide urine sample.   Clinical picture consistent with viral gastroenteritis versus foodborne illness.  Clinical concern for emergent underlying condition that would warrant further ED workup or patient management is exceedingly low. Tolerating PO.   [name] voiced understanding of her medical evaluation and treatment plan. Each of their questions answered to their expressed satisfaction.  Return precautions were given.  Patient is well-appearing, stable, and was discharged in good condition.  This chart was dictated using voice recognition software, Dragon. Despite the best efforts of this provider to proofread and correct errors, errors may still occur which can change documentation meaning.      Final diagnoses:  None    ED Discharge Orders     None          [name] 09/11/23 0239    Trine Raynell Moder, MD 09/11/23 [phone]

## 2023-09-11 NOTE — ED Notes (Signed)
 Patient d/c with home care instructions.

## 2023-09-11 NOTE — Discharge Instructions (Signed)
 You likely have a viral illness causing your symptoms, hide is also possible he may have a foodborne illness.  May use over-the-counter medication such as Imodium as needed for diarrhea, please increase your hydration, return to the ER with any severe symptoms.

## 2023-10-07 IMAGING — DX DG KNEE COMPLETE 4+V*R*
4 series · 4 of 4 positions shown · non-contrast
Comparison: Right knee radiographs 12/27/2018

CLINICAL DATA: Right knee pain.  Lump on knee with no known injury.

EXAM:
RIGHT KNEE - COMPLETE 4+ VIEW

[knee standing ap]
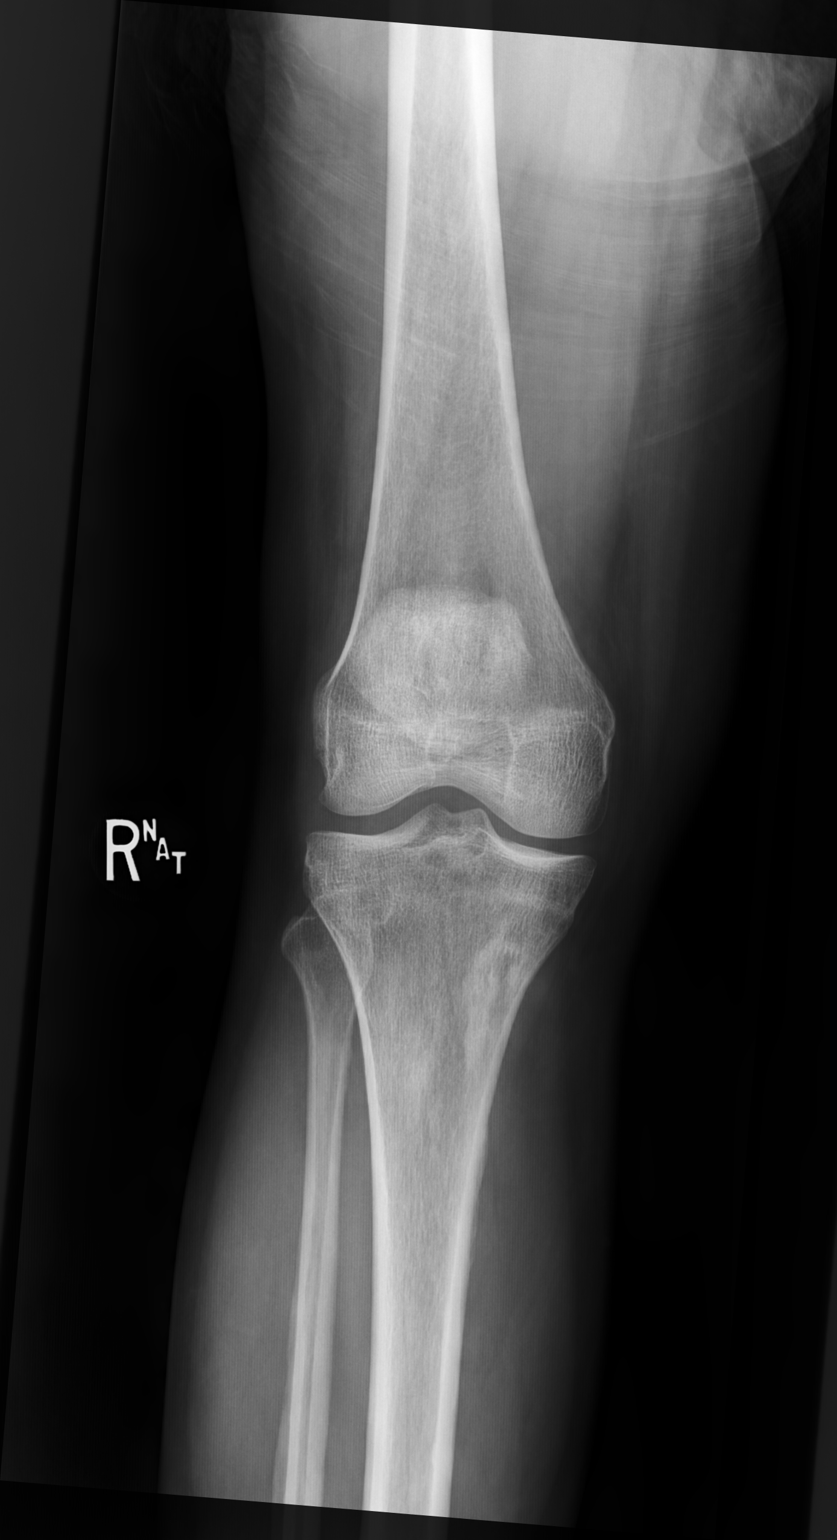

[knee lmo]
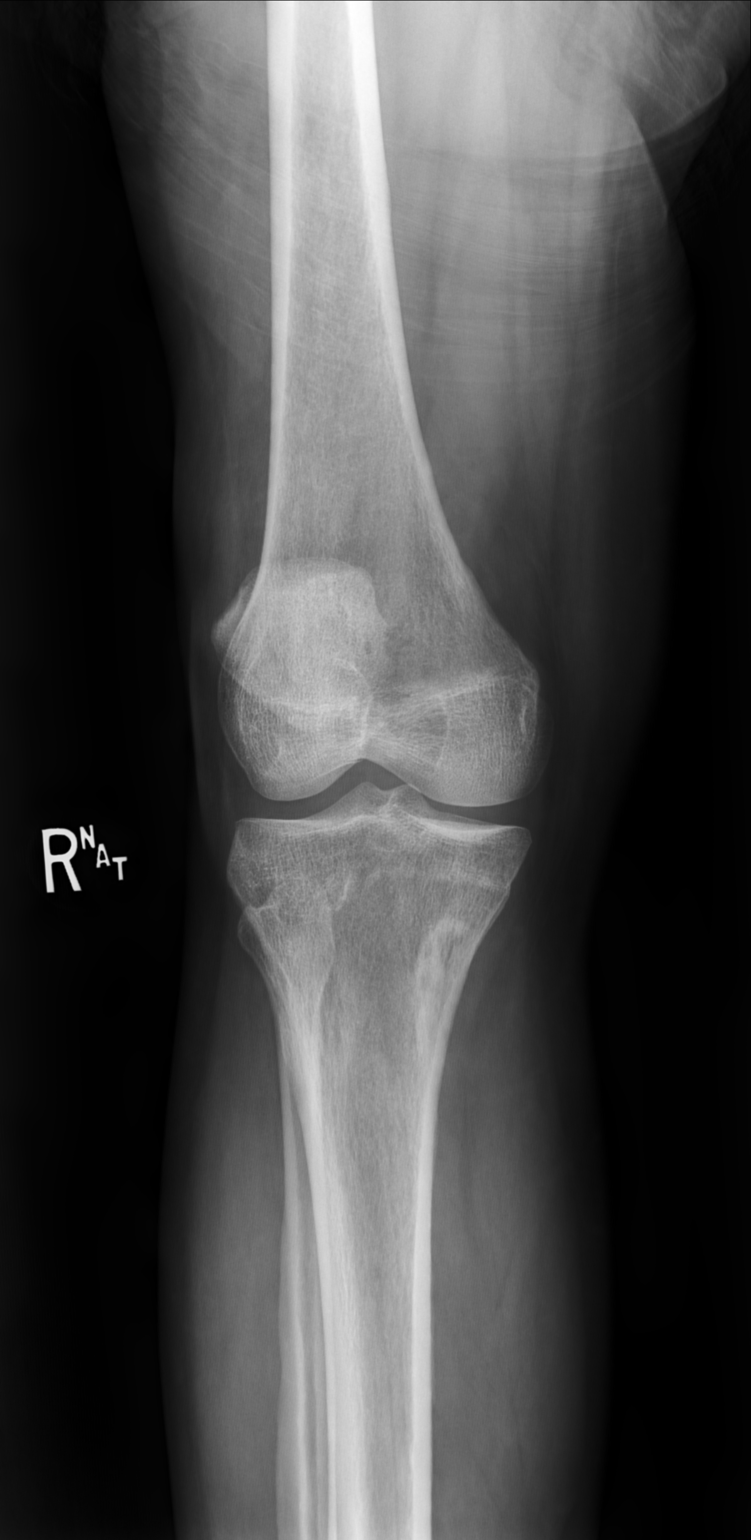

[knee mlo]
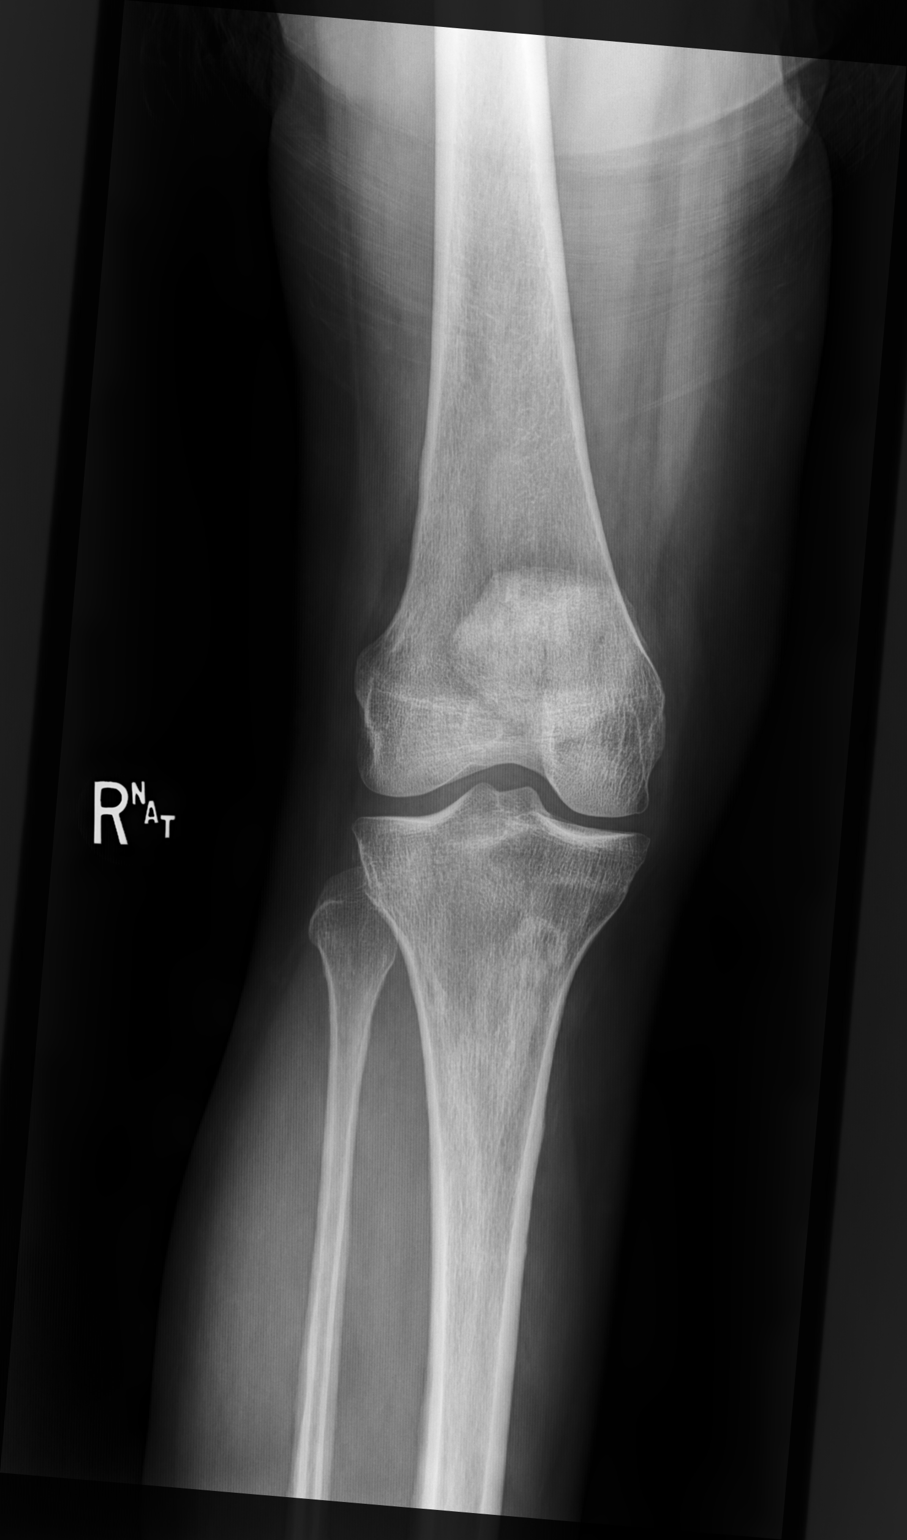

[knee standing lat]
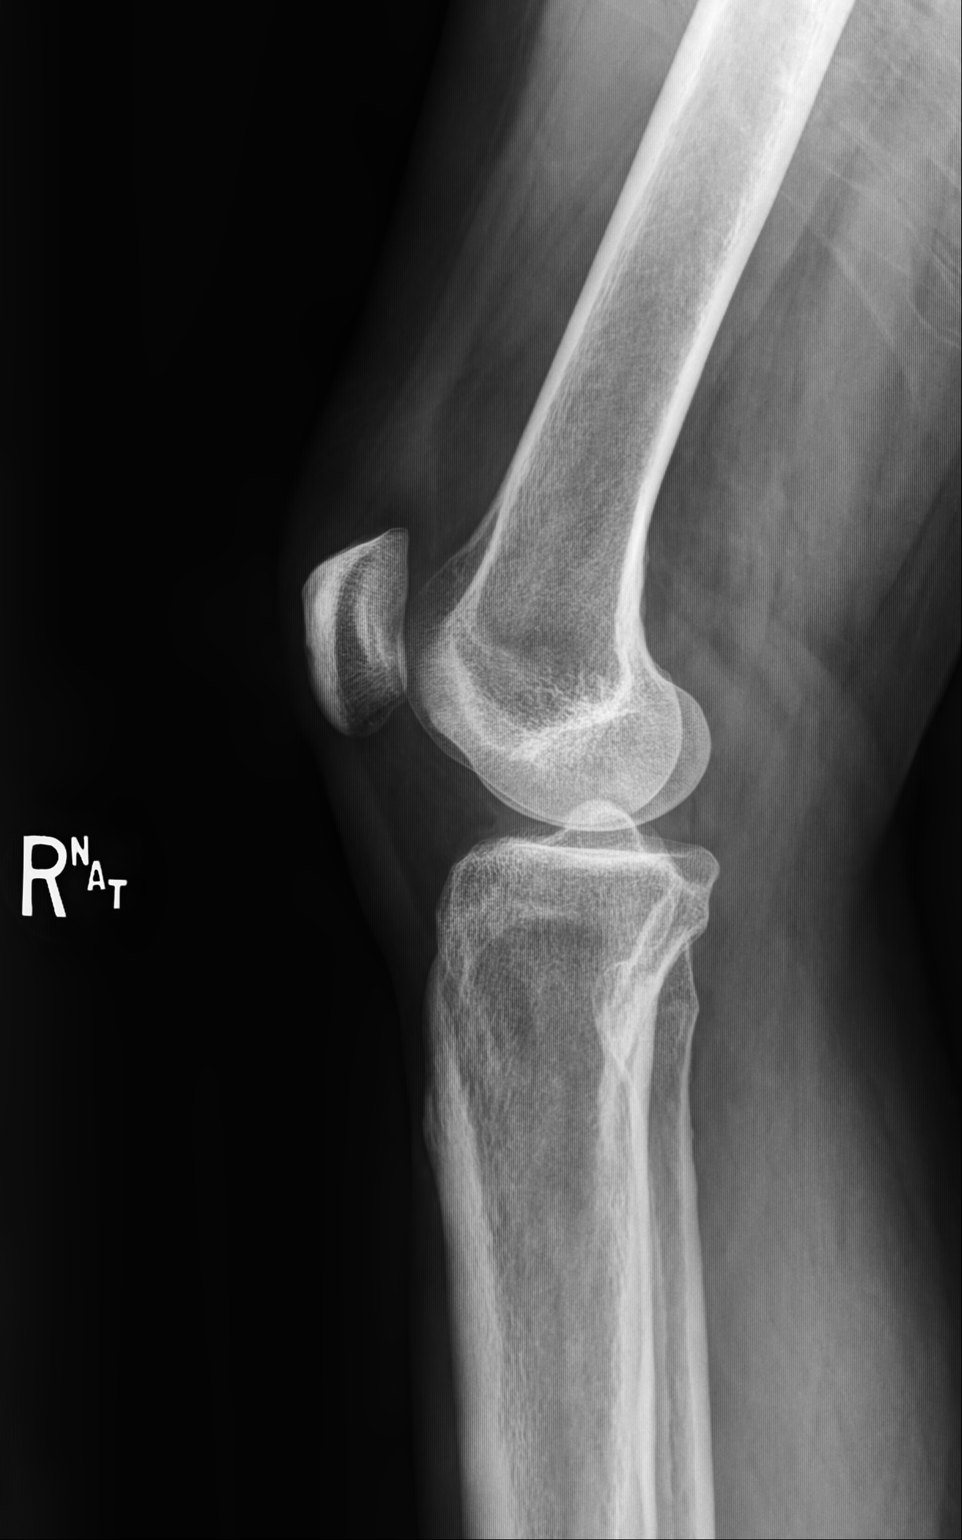

[4 of 4 positions shown; findings below may reference images not displayed]

FINDINGS: There is again a centrally lucent and peripherally sclerotic lesion
within the proximal medial tibial metadiaphysis posteriorly
consistent with a benign fibro-osseous lesion and likely a non
ossifying fibroma. No joint effusion. No calcified soft tissue mass
is seen.
IMPRESSION: No significant osteoarthritis.  No joint effusion.

Stable benign likely nonossifying fibroma of the proximal tibia.

## 2023-11-22 ENCOUNTER — Encounter: Payer: Self-pay | Admitting: Radiology

## 2024-04-25 ENCOUNTER — Encounter: Payer: PRIVATE HEALTH INSURANCE | Admitting: Family
